# Patient Record
Sex: Female | Born: 1965 | Race: Black or African American | Hispanic: No | Smoking: Never smoker
Health system: Southern US, Community
[De-identification: ages and names within clinical notes are randomized; demographics above are authoritative.]

## PROBLEM LIST (undated history)

## (undated) DIAGNOSIS — I10 Essential (primary) hypertension: Secondary | ICD-10-CM

## (undated) DIAGNOSIS — F32A Depression, unspecified: Secondary | ICD-10-CM

## (undated) DIAGNOSIS — F329 Major depressive disorder, single episode, unspecified: Secondary | ICD-10-CM

## (undated) DIAGNOSIS — R51 Headache: Secondary | ICD-10-CM

## (undated) DIAGNOSIS — D259 Leiomyoma of uterus, unspecified: Secondary | ICD-10-CM

## (undated) DIAGNOSIS — E785 Hyperlipidemia, unspecified: Secondary | ICD-10-CM

## (undated) DIAGNOSIS — R519 Headache, unspecified: Secondary | ICD-10-CM

## (undated) HISTORY — DX: Hyperlipidemia, unspecified: E78.5

## (undated) HISTORY — DX: Major depressive disorder, single episode, unspecified: F32.9

## (undated) HISTORY — DX: Depression, unspecified: F32.A

## (undated) HISTORY — DX: Headache, unspecified: R51.9

## (undated) HISTORY — DX: Headache: R51

## (undated) HISTORY — DX: Leiomyoma of uterus, unspecified: D25.9

## (undated) HISTORY — PX: DILATION AND CURETTAGE OF UTERUS: SHX78

---

## 2017-02-22 ENCOUNTER — Ambulatory Visit: Payer: Self-pay | Attending: Oncology | Admitting: *Deleted

## 2017-02-22 ENCOUNTER — Ambulatory Visit
Admission: RE | Admit: 2017-02-22 | Discharge: 2017-02-22 | Disposition: A | Discharge status: Home / Self Care | Payer: Self-pay | Source: Ambulatory Visit | Attending: Oncology | Admitting: Oncology

## 2017-02-22 ENCOUNTER — Encounter: Payer: Self-pay | Admitting: *Deleted

## 2017-02-22 ENCOUNTER — Encounter (INDEPENDENT_AMBULATORY_CARE_PROVIDER_SITE_OTHER): Payer: Self-pay

## 2017-02-22 VITALS — BP 127/80 | HR 60 | Temp 98.6°F | Ht 62.0 in | Wt 171.0 lb

## 2017-02-22 DIAGNOSIS — Z0000 Encounter for general adult medical examination without abnormal findings: Secondary | ICD-10-CM

## 2017-02-22 NOTE — Progress Notes (Signed)
Subjective:     Patient ID: Katelyn Baldwin, female   DOB: 12/03/1965, 51 y.o.   MRN: [id number]  HPI   Review of Systems     Objective:   Physical Exam  Pulmonary/Chest: Right breast exhibits no inverted nipple, no mass, no nipple discharge, no skin change and no tenderness. Left breast exhibits no inverted nipple, no mass, no nipple discharge, no skin change and no tenderness. Breasts are symmetrical.         Assessment:     51 year old Black female presents to [facility] for clinical breast exam and mammogram only.  Clinical breast exam with bilateral thickening at 12:00.  Greater thickening at the left.  Had second RN, Katelyn Baldwin exam the patient also with the same benign findings.  Taught self breast awareness.  Last pap on 10/16/12 was negative / negative.  Next pap due in 2019.  Patient has been screened for eligibility.  She does not have any insurance, Medicare or Medicaid.  She also meets financial eligibility.  Hand-out given on the Affordable Care Act.    Plan:     Screening mammogram ordered.  Will follow-up per protocol.

## 2017-02-22 NOTE — Patient Instructions (Signed)
Gave patient hand-out, Women Staying Healthy, Active and Well from [facility], with education on breast health, pap smears, heart and colon health. 

## 2017-03-03 ENCOUNTER — Inpatient Hospital Stay
Admission: RE | Admit: 2017-03-03 | Discharge: 2017-03-03 | Disposition: A | Discharge status: Home / Self Care | Payer: Self-pay | Source: Ambulatory Visit | Attending: *Deleted | Admitting: *Deleted

## 2017-03-03 ENCOUNTER — Other Ambulatory Visit: Payer: Self-pay | Admitting: *Deleted

## 2017-03-03 DIAGNOSIS — Z9289 Personal history of other medical treatment: Secondary | ICD-10-CM

## 2017-03-07 ENCOUNTER — Encounter: Payer: Self-pay | Admitting: *Deleted

## 2017-03-07 NOTE — Progress Notes (Signed)
Letter mailed from the [facility] to inform patient of her normal mammogram results.  Patient is to follow-up with annual screening in one year.  HSIS to Christy. 

## 2017-03-16 ENCOUNTER — Other Ambulatory Visit: Payer: Self-pay

## 2017-03-16 ENCOUNTER — Emergency Department
Admission: EM | Admit: 2017-03-16 | Discharge: 2017-03-16 | Disposition: A | Discharge status: Home / Self Care | Payer: 59 | Attending: Emergency Medicine | Admitting: Emergency Medicine

## 2017-03-16 ENCOUNTER — Emergency Department: Payer: 59

## 2017-03-16 ENCOUNTER — Encounter: Payer: Self-pay | Admitting: Emergency Medicine

## 2017-03-16 DIAGNOSIS — Y939 Activity, unspecified: Secondary | ICD-10-CM | POA: Insufficient documentation

## 2017-03-16 DIAGNOSIS — I10 Essential (primary) hypertension: Secondary | ICD-10-CM | POA: Diagnosis not present

## 2017-03-16 DIAGNOSIS — Y929 Unspecified place or not applicable: Secondary | ICD-10-CM | POA: Insufficient documentation

## 2017-03-16 DIAGNOSIS — M545 Low back pain, unspecified: Secondary | ICD-10-CM

## 2017-03-16 DIAGNOSIS — S3992XA Unspecified injury of lower back, initial encounter: Secondary | ICD-10-CM | POA: Diagnosis present

## 2017-03-16 DIAGNOSIS — X58XXXA Exposure to other specified factors, initial encounter: Secondary | ICD-10-CM | POA: Insufficient documentation

## 2017-03-16 DIAGNOSIS — Y999 Unspecified external cause status: Secondary | ICD-10-CM | POA: Diagnosis not present

## 2017-03-16 DIAGNOSIS — S39012A Strain of muscle, fascia and tendon of lower back, initial encounter: Secondary | ICD-10-CM | POA: Diagnosis not present

## 2017-03-16 HISTORY — DX: Essential (primary) hypertension: I10

## 2017-03-16 LAB — URINALYSIS, COMPLETE (UACMP) WITH MICROSCOPIC
Bilirubin Urine: NEGATIVE
GLUCOSE, UA: NEGATIVE mg/dL
HGB URINE DIPSTICK: NEGATIVE
KETONES UR: NEGATIVE mg/dL
Leukocytes, UA: NEGATIVE
NITRITE: NEGATIVE
PH: 5 (ref 5.0–8.0)
Protein, ur: NEGATIVE mg/dL
Specific Gravity, Urine: 1.017 (ref 1.005–1.030)

## 2017-03-16 LAB — POCT PREGNANCY, URINE: Preg Test, Ur: NEGATIVE

## 2017-03-16 MED ORDER — HYDROCODONE-ACETAMINOPHEN 5-325 MG PO TABS: 1.0000 | ORAL_TABLET | Freq: Four times a day (QID) | ORAL | 0 refills | Status: DC | PRN

## 2017-03-16 MED ORDER — OXYCODONE-ACETAMINOPHEN 5-325 MG PO TABS
1.0000 | ORAL_TABLET | Freq: Once | ORAL | Status: AC
Start: 2017-03-16 — End: 2017-03-16
  Administered 2017-03-16: 1 via ORAL
  Filled 2017-03-16: qty 1

## 2017-03-16 MED ORDER — METHOCARBAMOL 500 MG PO TABS
1000.0000 mg | ORAL_TABLET | Freq: Once | ORAL | Status: AC
  Administered 2017-03-16: 1000 mg via ORAL
  Filled 2017-03-16: qty 2

## 2017-03-16 MED ORDER — METHOCARBAMOL 500 MG PO TABS: 500.0000 mg | ORAL_TABLET | Freq: Four times a day (QID) | ORAL | 0 refills | Status: DC

## 2017-03-16 NOTE — ED Triage Notes (Signed)
Pt presents to ED with c/o L lower back pain. Pt states took 6 200mg  Ibuprofen yesterday, and Tylenol Arthritis 2 pills yesterday without relief. Pt states pain is worse in L lower back when she breathes or when she moves. Pt states back pain x 2 yrs.

## 2017-03-16 NOTE — Discharge Instructions (Signed)
Follow-up with [facility] clinic if any continued problems. Moist heat or ice to your  back as needed for discomfort. 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 You may take ibuprofen or tylenol  while at work.

## 2017-03-16 NOTE — ED Provider Notes (Signed)
[facility] Emergency Department Provider Note   ____________________________________________   First MD Initiated Contact with Patient 03/16/17 1050     (approximate)  I have reviewed the triage vital signs and the nursing notes.   HISTORY  Chief Complaint Back Pain   HPI Katelyn Baldwin is a 51 y.o. female is here with complaint of left lower back pain. Patient states that there has not been an actual injury. She states that she has had occasional back pain off and on for the last 2 years. She has taken ibuprofen yesterday and Tylenol without any relief. She denies any urinary symptoms or history of kidney stones. She denies any paresthesias into her lower extremities, incontinence of bowel or bladder.  Patient rates her pain as 10 over 10.   Past Medical History:  Diagnosis Date  . Hypertension     There are no active problems to display for this patient.   History reviewed. No pertinent surgical history.  Prior to Admission medications   Medication Sig Start Date End Date Taking? Authorizing Provider  HYDROcodone-acetaminophen (NORCO/VICODIN) 5-325 MG tablet Take 1 tablet by mouth every 6 (six) hours as needed for moderate pain. 03/16/17   Johnn Hai, PA-C  methocarbamol (ROBAXIN) 500 MG tablet Take 1 tablet (500 mg total) by mouth 4 (four) times daily. 03/16/17   Johnn Hai, PA-C    Allergies Patient has no known allergies.  Family History  Problem Relation Age of Onset  . Breast cancer Neg Hx     Social History Social History   Tobacco Use  . Smoking status: Never Smoker  . Smokeless tobacco: Never Used  Substance Use Topics  . Alcohol use: No    Frequency: Never  . Drug use: No    Review of Systems Constitutional: No fever/chills Eyes: No visual changes. Cardiovascular: Denies chest pain. Respiratory: Denies shortness of breath. Gastrointestinal: No abdominal pain.  No nausea, no vomiting.   Genitourinary:  Negative for dysuria. Negative for kidney stones. Musculoskeletal: positive for left back pain. Skin: Negative for rash. Neurological: Negative for headaches, focal weakness or numbness. ____________________________________________   PHYSICAL EXAM:  VITAL SIGNS: ED Triage Vitals  Enc Vitals Group     BP 03/16/17 1002 (!) 161/86     Pulse Rate 03/16/17 1002 77     Resp 03/16/17 1002 18     Temp 03/16/17 1002 98.2 F (36.8 C)     Temp Source 03/16/17 1002 Oral     SpO2 03/16/17 1002 98 %     Weight 03/16/17 1003 170 lb (77.1 kg)     Height 03/16/17 1003 5\' 1"  (1.549 m)     Head Circumference --      Peak Flow --      Pain Score 03/16/17 1009 10     Pain Loc --      Pain Edu? --      Excl. in [place]? --    Constitutional: Alert and oriented. Well appearing and in no acute distress. Eyes: Conjunctivae are normal. Head: Atraumatic. Neck: No stridor.   Cardiovascular: Normal rate, regular rhythm. Grossly normal heart sounds.  Good peripheral circulation. Respiratory: Normal respiratory effort.  No retractions. Lungs CTAB. Gastrointestinal: Soft and nontender. No distention.  No CVA tenderness. Musculoskeletal: on examination back there is no gross deformity however there is some mild lower lumbar tenderness to palpation on the spinous process and left paravertebral muscles. Range of motion is greatly restricted secondary to discomfort. Straight leg raises  are negative. Neurologic:  Normal speech and language. No gross focal neurologic deficits are appreciated. Reflexes are 2+ bilaterally. Gait was not assessed as patient had difficulty with movement. Skin:  Skin is warm, dry and intact.  Psychiatric: Mood and affect are normal. Speech and behavior are normal.  ____________________________________________   LABS (all labs ordered are listed, but only abnormal results are displayed)  Labs Reviewed  URINALYSIS, COMPLETE (UACMP) WITH MICROSCOPIC - Abnormal; Notable for the following  components:      Result Value   Color, Urine YELLOW (*)    APPearance HAZY (*)    Bacteria, UA RARE (*)    Squamous Epithelial / LPF 6-30 (*)    All other components within normal limits  POC URINE PREG, ED  POCT PREGNANCY, URINE    RADIOLOGY  Dg Lumbar Spine 2-3 Views  Result Date: 03/16/2017 CLINICAL DATA:  Lumbago EXAM: LUMBAR SPINE - 2-3 VIEW COMPARISON:  None. FINDINGS: Frontal, lateral, and spot lumbosacral lateral images were obtained. There are 5 non-rib-bearing lumbar type vertebral bodies. There is no fracture or spondylolisthesis. The disc spaces appear unremarkable. No erosive change. IMPRESSION: No fracture or spondylolisthesis. No appreciable arthropathic change. Electronically Signed   By: Lowella Grip III M.D.   On: 03/16/2017 12:08    ____________________________________________   PROCEDURES  Procedure(s) performed: None  Procedures  Critical Care performed: No  ____________________________________________   INITIAL IMPRESSION / ASSESSMENT AND PLAN / ED COURSE Patient was given Robaxin 1,000mg  and Percocet 5/325 while in the department. We discussed test results and patient is reassured that there are no acute abnormalities. Patient is to follow-up with [facility] clinic if any continued problems. She was discharged with a prescription for Robaxin 500 mg 4 times a day as needed for muscle spasms and Norco one every 6 hours and needed for pain.  ____________________________________________   FINAL CLINICAL IMPRESSION(S) / ED DIAGNOSES  Final diagnoses:  Strain of lumbar region, initial encounter  Acute bilateral low back pain without sciatica     ED Discharge Orders        Ordered    methocarbamol (ROBAXIN) 500 MG tablet  4 times daily     03/16/17 1220    HYDROcodone-acetaminophen (NORCO/VICODIN) 5-325 MG tablet  Every 6 hours PRN     03/16/17 1220       Note:  This document was prepared using Dragon voice recognition software and may  include unintentional dictation errors.    Johnn Hai, PA-C 03/16/17 1539    Darel Hong, MD 03/16/17 2237

## 2017-07-27 ENCOUNTER — Emergency Department
Admission: EM | Admit: 2017-07-27 | Discharge: 2017-07-27 | Disposition: A | Discharge status: Home / Self Care | Payer: No Typology Code available for payment source | Attending: Emergency Medicine | Admitting: Emergency Medicine

## 2017-07-27 ENCOUNTER — Encounter: Payer: Self-pay | Admitting: Emergency Medicine

## 2017-07-27 ENCOUNTER — Emergency Department: Payer: No Typology Code available for payment source

## 2017-07-27 DIAGNOSIS — I10 Essential (primary) hypertension: Secondary | ICD-10-CM | POA: Insufficient documentation

## 2017-07-27 DIAGNOSIS — Z79899 Other long term (current) drug therapy: Secondary | ICD-10-CM | POA: Insufficient documentation

## 2017-07-27 DIAGNOSIS — R51 Headache: Secondary | ICD-10-CM | POA: Insufficient documentation

## 2017-07-27 DIAGNOSIS — R519 Headache, unspecified: Secondary | ICD-10-CM

## 2017-07-27 DIAGNOSIS — R55 Syncope and collapse: Secondary | ICD-10-CM | POA: Diagnosis not present

## 2017-07-27 DIAGNOSIS — R42 Dizziness and giddiness: Secondary | ICD-10-CM | POA: Diagnosis present

## 2017-07-27 LAB — URINALYSIS, COMPLETE (UACMP) WITH MICROSCOPIC
BACTERIA UA: NONE SEEN
BILIRUBIN URINE: NEGATIVE
Glucose, UA: NEGATIVE mg/dL
Hgb urine dipstick: NEGATIVE
KETONES UR: 5 mg/dL — AB
Leukocytes, UA: NEGATIVE
Nitrite: NEGATIVE
PH: 5 (ref 5.0–8.0)
PROTEIN: NEGATIVE mg/dL
SQUAMOUS EPITHELIAL / LPF: NONE SEEN (ref 0–5)
Specific Gravity, Urine: 1.018 (ref 1.005–1.030)

## 2017-07-27 LAB — BASIC METABOLIC PANEL
Anion gap: 9 (ref 5–15)
BUN: 13 mg/dL (ref 6–20)
CHLORIDE: 101 mmol/L (ref 101–111)
CO2: 26 mmol/L (ref 22–32)
Calcium: 9.2 mg/dL (ref 8.9–10.3)
Creatinine, Ser: 0.72 mg/dL (ref 0.44–1.00)
GFR calc non Af Amer: >60 mL/min (ref >60)
Glucose, Bld: 96 mg/dL (ref 65–99)
POTASSIUM: 3.6 mmol/L (ref 3.5–5.1)
Sodium: 136 mmol/L (ref 135–145)

## 2017-07-27 LAB — HCG, QUANTITATIVE, PREGNANCY: HCG, BETA CHAIN, QUANT, S: 1 m[IU]/mL (ref <5)

## 2017-07-27 LAB — CBC
HEMATOCRIT: 36.6 % (ref 35.0–47.0)
Hemoglobin: 12.5 g/dL (ref 12.0–16.0)
MCH: 27.4 pg (ref 26.0–34.0)
MCHC: 34.1 g/dL (ref 32.0–36.0)
MCV: 80.4 fL (ref 80.0–100.0)
PLATELETS: 270 10*3/uL (ref 150–440)
RBC: 4.56 MIL/uL (ref 3.80–5.20)
RDW: 14.7 % — ABNORMAL HIGH (ref 11.5–14.5)
WBC: 4.6 10*3/uL (ref 3.6–11.0)

## 2017-07-27 LAB — TROPONIN I

## 2017-07-27 MED ORDER — METOCLOPRAMIDE HCL 5 MG/ML IJ SOLN
10.0000 mg | Freq: Once | INTRAMUSCULAR | Status: AC
  Administered 2017-07-27: 10 mg via INTRAVENOUS
  Filled 2017-07-27: qty 2

## 2017-07-27 MED ORDER — KETOROLAC TROMETHAMINE 30 MG/ML IJ SOLN
15.0000 mg | Freq: Once | INTRAMUSCULAR | Status: DC
  Filled 2017-07-27: qty 1

## 2017-07-27 MED ORDER — DIPHENHYDRAMINE HCL 50 MG/ML IJ SOLN
25.0000 mg | Freq: Once | INTRAMUSCULAR | Status: AC
  Administered 2017-07-27: 25 mg via INTRAVENOUS
  Filled 2017-07-27: qty 1

## 2017-07-27 MED ORDER — SODIUM CHLORIDE 0.9 % IV BOLUS
1000.0000 mL | Freq: Once | INTRAVENOUS | Status: AC
  Administered 2017-07-27: 1000 mL via INTRAVENOUS

## 2017-07-27 NOTE — ED Provider Notes (Signed)
[facility] Emergency Department Provider Note  ____________________________________________  Time seen: Approximately 6:22 PM  I have reviewed the triage vital signs and the nursing notes.   HISTORY  Chief Complaint Dizziness and Hypertension   HPI Katelyn Baldwin is a 52 y.o. female the history of hypertension migraine headaches presents for evaluation of a syncopal event.  Patient reports that she woke up this morning not feeling so great.  She went to work, she works in a preschool.  She had not had anything to eat.  Around lunchtime she started feeling 1 of her migraines coming on.  She reports that the pain started on the right side of her face, initially with visual floaters, photophobia, started mild and got progressively worse. She reports that she has these headaches all the time when she does not eat and she calls them "hungry headache". As she was walking to go to lunch, patient reports having a syncopal episode.  She denies feeling dizzy right before that.  When EMS arrived she was found to be hypertensive with BP of 210/118.  Currently her headaches 8 out of 10.  She continues to report that this is identical to her chronic headaches.  She endorses compliance with her antihypertensive medications.  She denies facial droop, slurred speech, unilateral weakness or numbness, chest pain, shortness of breath, abdominal pain, recent illness.   Past Medical History:  Diagnosis Date  . Hypertension     There are no active problems to display for this patient.   History reviewed. No pertinent surgical history.  Prior to Admission medications   Medication Sig Start Date End Date Taking? Authorizing Provider  hydrochlorothiazide (MICROZIDE) 12.5 MG capsule Take 1 capsule by mouth daily. 07/21/17  Yes [provider]  HYDROcodone-acetaminophen (NORCO/VICODIN) 5-325 MG tablet Take 1 tablet by mouth every 6 (six) hours as needed for moderate  pain. Patient not taking: Reported on 07/27/2017 03/16/17   Johnn Hai, PA-C  methocarbamol (ROBAXIN) 500 MG tablet Take 1 tablet (500 mg total) by mouth 4 (four) times daily. Patient not taking: Reported on 07/27/2017 03/16/17   Johnn Hai, PA-C    Allergies Patient has no known allergies.  Family History  Problem Relation Age of Onset  . Breast cancer Neg Hx     Social History Social History   Tobacco Use  . Smoking status: Never Smoker  . Smokeless tobacco: Never Used  Substance Use Topics  . Alcohol use: No    Frequency: Never  . Drug use: No    Review of Systems  Constitutional: Negative for fever. + syncope Eyes: Negative for visual changes. ENT: Negative for sore throat. Neck: No neck pain  Cardiovascular: Negative for chest pain. Respiratory: Negative for shortness of breath. Gastrointestinal: Negative for abdominal pain, vomiting or diarrhea. Genitourinary: Negative for dysuria. Musculoskeletal: Negative for back pain. Skin: Negative for rash. Neurological: + headache. No weakness or numbness. Psych: No SI or HI  ____________________________________________   PHYSICAL EXAM:  VITAL SIGNS: ED Triage Vitals  Enc Vitals Group     BP 07/27/17 1500 (!) 165/89     Pulse Rate 07/27/17 1500 75     Resp 07/27/17 1500 17     Temp 07/27/17 1500 98.6 F (37 C)     Temp Source 07/27/17 1500 Oral     SpO2 07/27/17 1500 96 %     Weight 07/27/17 1501 171 lb (77.6 kg)     Height 07/27/17 1501 4\' 11"  (1.499 m)  Head Circumference --      Peak Flow --      Pain Score 07/27/17 1501 0     Pain Loc --      Pain Edu? --      Excl. in [place]? --     Constitutional: Alert and oriented. Well appearing and in no apparent distress. HEENT:      Head: Normocephalic and atraumatic.         Eyes: Conjunctivae are normal. Sclera is non-icteric.  No raccoon eyes      Ears: No hemotympanum or battle sign        Mouth/Throat: Mucous membranes are moist.        Neck: Supple with no signs of meningismus. Cardiovascular: Regular rate and rhythm. No murmurs, gallops, or rubs. 2+ symmetrical distal pulses are present in all extremities. No JVD. Respiratory: Normal respiratory effort. Lungs are clear to auscultation bilaterally. No wheezes, crackles, or rhonchi.  Gastrointestinal: Soft, non tender, and non distended with positive bowel sounds. No rebound or guarding. Musculoskeletal: Nontender with normal range of motion in all extremities. No edema, cyanosis, or erythema of extremities. Neurologic: Normal speech and language. A & O x3, PERRL, EOMI, no nystagmus, CN II-XII intact, motor testing reveals good tone and bulk throughout. There is no evidence of pronator drift or dysmetria. Muscle strength is 5/5 throughout.  Sensory examination is intact. Gait is normal. Skin: Skin is warm, dry and intact. No rash noted. Psychiatric: Mood and affect are normal. Speech and behavior are normal.  ____________________________________________   LABS (all labs ordered are listed, but only abnormal results are displayed)  Labs Reviewed  CBC - Abnormal; Notable for the following components:      Result Value   RDW 14.7 (*)    All other components within normal limits  URINALYSIS, COMPLETE (UACMP) WITH MICROSCOPIC - Abnormal; Notable for the following components:   Color, Urine YELLOW (*)    APPearance CLEAR (*)    Ketones, ur 5 (*)    All other components within normal limits  BASIC METABOLIC PANEL  HCG, QUANTITATIVE, PREGNANCY  TROPONIN I   ____________________________________________  EKG  ED ECG REPORT I, Rudene Re, the attending physician, personally viewed and interpreted this ECG.  Normal sinus rhythm, normal intervals, normal axis, no STE, less than 1 mm ST depressions in 2, 3, aVF, V3 to V6, no evidence of HOCM, AV block, delta wave, ARVD, prolonged QTc, WPW, or Brugada.  No prior for  comparison  ____________________________________________  RADIOLOGY  I have personally reviewed the images performed during this visit and I agree with the Radiologist's read.   Interpretation by Radiologist:  Ct Head Wo Contrast  Result Date: 07/27/2017 CLINICAL DATA:  Hypertension with syncopal episode EXAM: CT HEAD WITHOUT CONTRAST TECHNIQUE: Contiguous axial images were obtained from the base of the skull through the vertex without intravenous contrast. COMPARISON:  None. FINDINGS: Brain: No evidence of acute infarction, hemorrhage, hydrocephalus, extra-axial collection or mass lesion/mass effect. Vascular: No hyperdense vessel or unexpected calcification. Skull: Normal. Negative for fracture or focal lesion. Sinuses/Orbits: No acute finding. Other: None IMPRESSION: Negative non contrasted CT appearance of the brain. Electronically Signed   By: Donavan Foil M.D.   On: 07/27/2017 18:39     ____________________________________________   PROCEDURES  Procedure(s) performed: None Procedures Critical Care performed:  None ____________________________________________   INITIAL IMPRESSION / ASSESSMENT AND PLAN / ED COURSE   52 y.o. female the history of hypertension migraine headaches presents for evaluation of  a syncopal event.  The event was preceded by 1 of patient's chronic headaches.  She denies thunderclap headache.  She is neurologically intact with no signs of trauma.  Her EKG shows slight ST depressions in inferior and lateral leads with no ST elevation, no evidence of dysrhythmia.  There is not a prior EKG for comparison. Troponin is pending.  Patient has mild ketones in her urine.  I believe her presentation is due to mild dehydration and the fact the patient did not eat today.  Head CT has been done within 6 hours of onset of HA with no evidence of SAH. Patient is completely neurologically intact with no signs of stroke. Labs showing normal creatinine and electrolytes. Will  treat HA with migraine cocktail.     _________________________ 8:28 PM on 07/27/2017 -----------------------------------------  Patient's headache has fully resolved.  She remains extremely well-appearing, neurologically intact with normal vital signs.  At this time she is stable for discharge.  Recommended close follow-up with primary care doctor and discussed return precautions.   As part of my medical decision making, I reviewed the following data within the [place] notes reviewed and incorporated, Labs reviewed , EKG interpreted , Old chart reviewed, Radiograph reviewed , Notes from prior ED visits and [place] Controlled Substance Database    Pertinent labs & imaging results that were available during my care of the patient were reviewed by me and considered in my medical decision making (see chart for details).    ____________________________________________   FINAL CLINICAL IMPRESSION(S) / ED DIAGNOSES  Final diagnoses:  Syncope, unspecified syncope type  Acute nonintractable headache, unspecified headache type      NEW MEDICATIONS STARTED DURING THIS VISIT:  ED Discharge Orders    None       Note:  This document was prepared using Dragon voice recognition software and may include unintentional dictation errors.    Rudene Re, MD 07/27/17 2029

## 2017-07-27 NOTE — ED Triage Notes (Signed)
Pt comes into the ED via POV c/o HTN and syncopal episode.  Pt c/o headache with photosensitivity.  Patient had a syncopal episode at work and had EMS called but she declined coming with them.  Patient states she did hit her head, but denies any blood thinners. Patient is neurologically intact.  Her BP earlier today was 210/118.  Patient has HTN and takes medication for it.  Patient does not currently have excessively high BP at this time in triage.  Patient in NAD with even and unlabored respirations.

## 2017-07-27 NOTE — Discharge Instructions (Addendum)
You have been seen in the Emergency Department (ED) for a headache. Your evaluation today was overall reassuring. Headaches have many possible causes. Most headaches aren't a sign of a more serious problem, and they will get better on their own.   Follow-up with your doctor in 12-24 hours if you are still having a headache. Otherwise follow up with your doctor in 3-5 days.  For pain take tylenol or motrin  When should you call for help?  Call 911 or return to the ED anytime you think you may need emergency care. For example, call if:  You have signs of a stroke. These may include:  Sudden numbness, paralysis, or weakness in your face, arm, or leg, especially on only one side of your body.  Sudden vision changes.  Sudden trouble speaking.  Sudden confusion or trouble understanding simple statements.  Sudden problems with walking or balance.  A sudden, severe headache that is different from past headaches. You have new or worsening headache Nausea and vomiting associated with your headache Fever, neck stiffness associated with your headache  Call your doctor now or seek immediate medical care if:  You have a new or worse headache.  Your headache gets much worse.  How can you care for yourself at home?  Do not drive if you have taken a prescription pain medicine.  Rest in a quiet, dark room until your headache is gone. Close your eyes and try to relax or go to sleep. Don't watch TV or read.  Put a cold, moist cloth or cold pack on the painful area for 10 to 20 minutes at a time. Put a thin cloth between the cold pack and your skin.  Use a warm, moist towel or a heating pad set on low to relax tight shoulder and neck muscles.  Have someone gently massage your neck and shoulders.  Take pain medicines exactly as directed.  If the doctor gave you a prescription medicine for pain, take it as prescribed.  If you are not taking a prescription pain medicine, ask your doctor if you can take an  over-the-counter medicine. Be careful not to take pain medicine more often than the instructions allow, because you may get worse or more frequent headaches when the medicine wears off.  Do not ignore new symptoms that occur with a headache, such as a fever, weakness or numbness, vision changes, or confusion. These may be signs of a more serious problem.  To prevent headaches  Keep a headache diary so you can figure out what triggers your headaches. Avoiding triggers may help you prevent headaches. Record when each headache began, how long it lasted, and what the pain was like (throbbing, aching, stabbing, or dull). Write down any other symptoms you had with the headache, such as nausea, flashing lights or dark spots, or sensitivity to bright light or loud noise. Note if the headache occurred near your period. List anything that might have triggered the headache, such as certain foods (chocolate, cheese, wine) or odors, smoke, bright light, stress, or lack of sleep.  Find healthy ways to deal with stress. Headaches are most common during or right after stressful times. Take time to relax before and after you do something that has caused a headache in the past.  Try to keep your muscles relaxed by keeping good posture. Check your jaw, face, neck, and shoulder muscles for tension, and try relaxing them. When sitting at a desk, change positions often, and stretch for 30 seconds each hour.  Get plenty of sleep and exercise.  °Eat regularly and well. Long periods without food can trigger a headache.  °Treat yourself to a massage. Some people find that regular massages are very helpful in relieving tension.  °Limit caffeine by not drinking too much coffee, tea, or soda. But don't quit caffeine suddenly, because that can also give you headaches.  °Reduce eyestrain from computers by blinking frequently and looking away from the computer screen every so often. Make sure you have proper eyewear and that your monitor  is set up properly, about an arm's length away.  °Seek help if you have depression or anxiety. Your headaches may be linked to these conditions. Treatment can both prevent headaches and help with symptoms of anxiety or depression. ° °

## 2017-08-15 ENCOUNTER — Telehealth: Payer: Self-pay | Admitting: Internal Medicine

## 2017-08-15 ENCOUNTER — Ambulatory Visit: Payer: 59 | Admitting: Internal Medicine

## 2017-08-15 ENCOUNTER — Ambulatory Visit (INDEPENDENT_AMBULATORY_CARE_PROVIDER_SITE_OTHER): Payer: 59

## 2017-08-15 ENCOUNTER — Encounter: Payer: Self-pay | Admitting: Internal Medicine

## 2017-08-15 VITALS — BP 138/70 | HR 65 | Temp 98.3°F | Ht 59.0 in | Wt 178.4 lb

## 2017-08-15 DIAGNOSIS — Z1159 Encounter for screening for other viral diseases: Secondary | ICD-10-CM

## 2017-08-15 DIAGNOSIS — F329 Major depressive disorder, single episode, unspecified: Secondary | ICD-10-CM | POA: Diagnosis not present

## 2017-08-15 DIAGNOSIS — Z1211 Encounter for screening for malignant neoplasm of colon: Secondary | ICD-10-CM

## 2017-08-15 DIAGNOSIS — F439 Reaction to severe stress, unspecified: Secondary | ICD-10-CM | POA: Diagnosis not present

## 2017-08-15 DIAGNOSIS — N926 Irregular menstruation, unspecified: Secondary | ICD-10-CM | POA: Diagnosis not present

## 2017-08-15 DIAGNOSIS — H6123 Impacted cerumen, bilateral: Secondary | ICD-10-CM

## 2017-08-15 DIAGNOSIS — F32A Depression, unspecified: Secondary | ICD-10-CM | POA: Insufficient documentation

## 2017-08-15 DIAGNOSIS — E559 Vitamin D deficiency, unspecified: Secondary | ICD-10-CM

## 2017-08-15 DIAGNOSIS — Z23 Encounter for immunization: Secondary | ICD-10-CM | POA: Diagnosis not present

## 2017-08-15 DIAGNOSIS — I10 Essential (primary) hypertension: Secondary | ICD-10-CM | POA: Diagnosis not present

## 2017-08-15 DIAGNOSIS — R202 Paresthesia of skin: Secondary | ICD-10-CM

## 2017-08-15 DIAGNOSIS — R0602 Shortness of breath: Secondary | ICD-10-CM

## 2017-08-15 DIAGNOSIS — H9209 Otalgia, unspecified ear: Secondary | ICD-10-CM

## 2017-08-15 DIAGNOSIS — Z1329 Encounter for screening for other suspected endocrine disorder: Secondary | ICD-10-CM | POA: Diagnosis not present

## 2017-08-15 DIAGNOSIS — R200 Anesthesia of skin: Secondary | ICD-10-CM | POA: Diagnosis not present

## 2017-08-15 DIAGNOSIS — Z0184 Encounter for antibody response examination: Secondary | ICD-10-CM

## 2017-08-15 DIAGNOSIS — E669 Obesity, unspecified: Secondary | ICD-10-CM

## 2017-08-15 MED ORDER — AMLODIPINE BESYLATE 2.5 MG PO TABS: 2.5000 mg | ORAL_TABLET | Freq: Every day | ORAL | 3 refills | Status: DC

## 2017-08-15 NOTE — Telephone Encounter (Signed)
Please advise 

## 2017-08-15 NOTE — Telephone Encounter (Signed)
Copied from [place] [phone]. Topic: Quick Communication - Rx Refill/Question >> Aug 15, 2017  6:26 PM Tye [place] wrote: Pt called and states that she was suppose to get an ear drop Rx for her ears but did not get it, call pt to advise

## 2017-08-15 NOTE — Progress Notes (Signed)
Chief Complaint  Patient presents with  . New Patient (Initial Visit)   New patient multiple issues.  1. C/o ear pain  2.HTN 138/70 on hctz 12.5 but it does spike at times >200/>165. She feels sensation in her back when her BP elevates  3. Recently saw white spots and eyes checked [facility] will get records  4. H/o depression and stress (kids are source of stress ages 59, 43, 51) PHQ 9 score 20 she wants a referral for therapy instead of medication currently  5. C/o left sided tingling randomly and intermittently esp 4th and 5th fingers left hand. Denies neck pain. CT head 07/27/17 normal in the ED 6. Chronic left knee pain and low back pain recent lumbar Xray normal  7. C/o irregular cycles and has not had menses in 2 months she is unsure is has menopause  8. Obesity BMI >36 does not currently work out and likes sweets  Review of Systems  Constitutional: Negative for weight loss.  HENT: Positive for ear pain. Negative for hearing loss.   Eyes:       +seeing white spots  Respiratory: Negative for shortness of breath.   Cardiovascular: Negative for chest pain.  Gastrointestinal: Negative for abdominal pain.  Musculoskeletal: Negative for back pain and joint pain.       +chronic left knee and low back pain   Skin: Negative for rash.  Neurological: Positive for dizziness. Negative for headaches.  Psychiatric/Behavioral: Positive for depression.       +stress    Past Medical History:  Diagnosis Date  . Hypertension    History reviewed. No pertinent surgical history. Family History  Problem Relation Age of Onset  . Breast cancer Neg Hx    Social History   Socioeconomic History  . Marital status: Widowed    Spouse name: Not on file  . Number of children: Not on file  . Years of education: Not on file  . Highest education level: Not on file  Occupational History  . Not on file  Social Needs  . Financial resource strain: Not on file  . Food insecurity:   Worry: Not on file    Inability: Not on file  . Transportation needs:    Medical: Not on file    Non-medical: Not on file  Tobacco Use  . Smoking status: Never Smoker  . Smokeless tobacco: Never Used  Substance and Sexual Activity  . Alcohol use: No    Frequency: Never  . Drug use: No  . Sexual activity: Not on file  Lifestyle  . Physical activity:    Days per week: Not on file    Minutes per session: Not on file  . Stress: Not on file  Relationships  . Social connections:    Talks on phone: Not on file    Gets together: Not on file    Attends religious service: Not on file    Active member of club or organization: Not on file    Attends meetings of clubs or organizations: Not on file    Relationship status: Not on file  . Intimate partner violence:    Fear of current or ex partner: Not on file    Emotionally abused: Not on file    Physically abused: Not on file    Forced sexual activity: Not on file  Other Topics Concern  . Not on file  Social History Narrative  . Not on file   Current Meds  Medication  Sig  . hydrochlorothiazide (MICROZIDE) 12.5 MG capsule Take 1 capsule by mouth daily.   No Known Allergies Recent Results (from the past 2160 hour(s))  Basic metabolic panel     Status: None   Collection Time: 07/27/17  3:02 PM  Result Value Ref Range   Sodium 136 135 - 145 mmol/L   Potassium 3.6 3.5 - 5.1 mmol/L   Chloride 101 101 - 111 mmol/L   CO2 26 22 - 32 mmol/L   Glucose, Bld 96 65 - 99 mg/dL   BUN 13 6 - 20 mg/dL   Creatinine, Ser 0.72 0.44 - 1.00 mg/dL   Calcium 9.2 8.9 - 10.3 mg/dL   GFR calc non Af Amer >60 >60 mL/min   GFR calc Af Amer >60 >60 mL/min    Comment: (NOTE) The eGFR has been calculated using the CKD EPI equation. This calculation has not been validated in all clinical situations. eGFR's persistently <60 mL/min signify possible Chronic Kidney Disease.    Anion gap 9 5 - 15    Comment: Performed at [facility], [address] [place], [place] [address]  CBC     Status: Abnormal   Collection Time: 07/27/17  3:02 PM  Result Value Ref Range   WBC 4.6 3.6 - 11.0 K/uL   RBC 4.56 3.80 - 5.20 MIL/uL   Hemoglobin 12.5 12.0 - 16.0 g/dL   HCT 36.6 35.0 - 47.0 %   MCV 80.4 80.0 - 100.0 fL   MCH 27.4 26.0 - 34.0 pg   MCHC 34.1 32.0 - 36.0 g/dL   RDW 14.7 (H) 11.5 - 14.5 %   Platelets 270 150 - 440 K/uL    Comment: Performed at [facility], [address] [place], [place] [address]  Urinalysis, Complete w Microscopic     Status: Abnormal   Collection Time: 07/27/17  3:02 PM  Result Value Ref Range   Color, Urine YELLOW (A) YELLOW   APPearance CLEAR (A) CLEAR   Specific Gravity, Urine 1.018 1.005 - 1.030   pH 5.0 5.0 - 8.0   Glucose, UA NEGATIVE NEGATIVE mg/dL   Hgb urine dipstick NEGATIVE NEGATIVE   Bilirubin Urine NEGATIVE NEGATIVE   Ketones, ur 5 (A) NEGATIVE mg/dL   Protein, ur NEGATIVE NEGATIVE mg/dL   Nitrite NEGATIVE NEGATIVE   Leukocytes, UA NEGATIVE NEGATIVE   RBC / HPF 0-5 0 - 5 RBC/hpf   WBC, UA 0-5 0 - 5 WBC/hpf   Bacteria, UA NONE SEEN NONE SEEN   Squamous Epithelial / LPF NONE SEEN 0 - 5    Comment: Please note change in reference range.   Mucus PRESENT     Comment: Performed at [facility], [address] [place], [place] [address]  hCG, quantitative, pregnancy     Status: None   Collection Time: 07/27/17  3:02 PM  Result Value Ref Range   hCG, Beta Chain, Quant, S 1 <5 mIU/mL    Comment:          GEST. AGE      CONC.  (mIU/mL)   <=1 WEEK        5 - 50     2 WEEKS       50 - 500     3 WEEKS       100 - 10,000     4 WEEKS     1,000 - 30,000     5 WEEKS     3,500 - 115,000  6-8 WEEKS     12,000 - 270,000    12 WEEKS     15,000 - 220,000        FEMALE AND NON-PREGNANT FEMALE:     LESS THAN 5 mIU/mL Performed at [facility], [address] [place], [place] [address]   Troponin I     Status: None   Collection Time: 07/27/17  3:02 PM  Result Value  Ref Range   Troponin I <0.03 <0.03 ng/mL    Comment: Performed at [facility], [address] [place], [place] [address]   Objective  Body mass index is 36.03 kg/m. Wt Readings from Last 3 Encounters:  08/15/17 178 lb 6.4 oz (80.9 kg)  07/27/17 171 lb (77.6 kg)  03/16/17 170 lb (77.1 kg)   Temp Readings from Last 3 Encounters:  08/15/17 98.3 F (36.8 C) (Oral)  07/27/17 98.6 F (37 C) (Oral)  03/16/17 98.2 F (36.8 C) (Oral)   BP Readings from Last 3 Encounters:  08/15/17 138/70  07/27/17 (!) 156/94  03/16/17 (!) 160/85   Pulse Readings from Last 3 Encounters:  08/15/17 65  07/27/17 76  03/16/17 75    Physical Exam  Constitutional: She is oriented to person, place, and time. Vital signs are normal. She appears well-developed and well-nourished. She is cooperative.  HENT:  Head: Normocephalic and atraumatic.  Mouth/Throat: Oropharynx is clear and moist and mucous membranes are normal.  Cerumen impaction b/l ears   Eyes: Pupils are equal, round, and reactive to light. Conjunctivae are normal.  Cardiovascular: Normal rate, regular rhythm and normal heart sounds.  Pulmonary/Chest: Effort normal and breath sounds normal.  Abdominal: Soft. Bowel sounds are normal.  Neurological: She is alert and oriented to person, place, and time. Gait normal.  Skin: Skin is warm, dry and intact.  Psychiatric: She has a normal mood and affect. Her speech is normal and behavior is normal. Judgment and thought content normal. Cognition and memory are normal.  Nursing note and vitals reviewed.   Assessment   1. Otalgia likely 2/2 cerumen impaction  2. HTN with intermittent spikes  3. Depression and stress PHQ 9 score 20  4. Left arm and fingers 4th and 5th tingling CT head neg 07/27/17  5. Chronic left knee pain and low back pain  6. Irregular menses not had cycle in 2 months  7. Obesity BMI >36  8. HM Plan   1.cleaned ears today rec use debrox drops 2. Add norvasc 2.5 mg  qd may take another 2.5 mg if BP>140/>90  Cont hctz 12.5 mg qd  Get [facility] records 07/2017  Consider echo of heart in future given chronic HTN to look for LVH Check CXR today r/o pulm edema with BP >200/>100 at times  3. Refer to [facility] therapy pt does not want meds for now  4. Consider EMG/NCS, MRI brain 5. Lumbar Xray just normal  6. Check FSH 7. Disc exercsie and healthy diet choicse  8.l  No longer takes flu shot  Given Tdap today  Hep B  Declines STD check  Pap at f/u  Referred colonoscopy prefers female  Mammogram 02/22/17 normal   Provider: Dr. Olivia Mackie McLean-Scocuzza-Internal Medicine

## 2017-08-15 NOTE — Progress Notes (Signed)
Pre visit review using our clinic review tool, if applicable. No additional management support is needed unless otherwise documented below in the visit note. 

## 2017-08-15 NOTE — Patient Instructions (Addendum)
Schedule fasting labs after 09/03/17  F/u with me in 1 month  Will refer you to therapy locally  Please get your glasses  We will do CXR today  Meditation apps for stress on the phone Insight timer, Calm, Headspace   We will refer you to GI for colonoscopy and do pap at follow up   Paresthesia Paresthesia is an abnormal burning or prickling sensation. This sensation is generally felt in the hands, arms, legs, or feet. However, it may occur in any part of the body. Usually, it is not painful. The feeling may be described as:  Tingling or numbness.  Pins and needles.  Skin crawling.  Buzzing.  Limbs falling asleep.  Itching.  Most people experience temporary (transient) paresthesia at some time in their lives. Paresthesia may occur when you breathe too quickly (hyperventilation). It can also occur without any apparent cause. Commonly, paresthesia occurs when pressure is placed on a nerve. The sensation quickly goes away after the pressure is removed. For some people, however, paresthesia is a long-lasting (chronic) condition that is caused by an underlying disorder. If you continue to have paresthesia, you may need further medical evaluation. Follow these instructions at home: Watch your condition for any changes. Taking the following actions may help to lessen any discomfort that you are feeling:  Avoid drinking alcohol.  Try acupuncture or massage to help relieve your symptoms.  Keep all follow-up visits as directed by your health care provider. This is important.  Contact a health care provider if:  You continue to have episodes of paresthesia.  Your burning or prickling feeling gets worse when you walk.  You have pain, cramps, or dizziness.  You develop a rash. Get help right away if:  You feel weak.  You have trouble walking or moving.  You have problems with speech, understanding, or vision.  You feel confused.  You cannot control your bladder or bowel  movements.  You have numbness after an injury.  You faint. This information is not intended to replace advice given to you by your health care provider. Make sure you discuss any questions you have with your health care provider. Document Released: 03/11/2002 Document Revised: 08/27/2015 Document Reviewed: 03/17/2014 Elsevier Interactive Patient Education  2018 [place], Adult An earache, or ear pain, can be caused by many things, including:  An infection.  Ear wax buildup.  Ear pressure.  Something in the ear that should not be there (foreign body).  A sore throat.  Tooth problems.  Jaw problems.  Treatment of the earache will depend on the cause. If the cause is not clear or cannot be determined, you may need to watch your symptoms until your earache goes away or until a cause is found. Follow these instructions at home: Pay attention to any changes in your symptoms. Take these actions to help with your pain:  Take or apply over-the-counter and prescription medicines only as told by your health care provider.  If you were prescribed an antibiotic medicine, use it as told by your health care provider. Do not stop using the antibiotic even if you start to feel better.  Do not put anything in your ear other than medicine that is prescribed by your health care provider.  If directed, apply heat to the affected area as often as told by your health care provider. Use the heat source that your health care provider recommends, such as a moist heat pack or a heating pad. ? Place a towel  between your skin and the heat source. ? Leave the heat on for 20-30 minutes. ? Remove the heat if your skin turns bright red. This is especially important if you are unable to feel pain, heat, or cold. You may have a greater risk of getting burned.  If directed, put ice on the ear: ? Put ice in a plastic bag. ? Place a towel between your skin and the bag. ? Leave the ice on for 20  minutes, 2-3 times a day.  Try resting in an upright position instead of lying down. This may help to reduce pressure in your ear and relieve pain.  Chew gum if it helps to relieve your ear pain.  Treat any allergies as told by your health care provider.  Keep all follow-up visits as told by your health care provider. This is important.  Contact a health care provider if:  Your pain does not improve within 2 days.  Your earache gets worse.  You have new symptoms.  You have a fever. Get help right away if:  You have a severe headache.  You have a stiff neck.  You have trouble swallowing.  You have redness or swelling behind your ear.  You have fluid or blood coming from your ear.  You have hearing loss.  You feel dizzy. This information is not intended to replace advice given to you by your health care provider. Make sure you discuss any questions you have with your health care provider. Document Released: 11/06/2003 Document Revised: 11/17/2015 Document Reviewed: 09/14/2015 Elsevier Interactive Patient Education  2018 [employer].  Dizziness Dizziness is a common problem. It is a feeling of unsteadiness or light-headedness. You may feel like you are about to faint. Dizziness can lead to injury if you stumble or fall. Anyone can become dizzy, but dizziness is more common in older adults. This condition can be caused by a number of things, including medicines, dehydration, or illness. Follow these instructions at home: Eating and drinking  Drink enough fluid to keep your urine clear or pale yellow. This helps to keep you from becoming dehydrated. Try to drink more clear fluids, such as water.  Do not drink alcohol.  Limit your caffeine intake if told to do so by your health care provider. Check ingredients and nutrition facts to see if a food or beverage contains caffeine.  Limit your salt (sodium) intake if told to do so by your health care provider. Check  ingredients and nutrition facts to see if a food or beverage contains sodium. Activity  Avoid making quick movements. ? Rise slowly from chairs and steady yourself until you feel okay. ? In the morning, first sit up on the side of the bed. When you feel okay, stand slowly while you hold onto something until you know that your balance is fine.  If you need to stand in one place for a long time, move your legs often. Tighten and relax the muscles in your legs while you are standing.  Do not drive or use heavy machinery if you feel dizzy.  Avoid bending down if you feel dizzy. Place items in your home so that they are easy for you to reach without leaning over. Lifestyle  Do not use any products that contain nicotine or tobacco, such as cigarettes and e-cigarettes. If you need help quitting, ask your health care provider.  Try to reduce your stress level by using methods such as yoga or meditation. Talk with your health care provider  if you need help to manage your stress. General instructions  Watch your dizziness for any changes.  Take over-the-counter and prescription medicines only as told by your health care provider. Talk with your health care provider if you think that your dizziness is caused by a medicine that you are taking.  Tell a friend or a family member that you are feeling dizzy. If he or she notices any changes in your behavior, have this person call your health care provider.  Keep all follow-up visits as told by your health care provider. This is important. Contact a health care provider if:  Your dizziness does not go away.  Your dizziness or light-headedness gets worse.  You feel nauseous.  You have reduced hearing.  You have new symptoms.  You are unsteady on your feet or you feel like the room is spinning. Get help right away if:  You vomit or have diarrhea and are unable to eat or drink anything.  You have problems talking, walking, swallowing, or using  your arms, hands, or legs.  You feel generally weak.  You are not thinking clearly or you have trouble forming sentences. It may take a friend or family member to notice this.  You have chest pain, abdominal pain, shortness of breath, or sweating.  Your vision changes.  You have any bleeding.  You have a severe headache.  You have neck pain or a stiff neck.  You have a fever. These symptoms may represent a serious problem that is an emergency. Do not wait to see if the symptoms will go away. Get medical help right away. Call your local emergency services (911 in the [place] Do not drive yourself to the hospital. Summary  Dizziness is a feeling of unsteadiness or light-headedness. This condition can be caused by a number of things, including medicines, dehydration, or illness.  Anyone can become dizzy, but dizziness is more common in older adults.  Drink enough fluid to keep your urine clear or pale yellow. Do not drink alcohol.  Avoid making quick movements if you feel dizzy. Monitor your dizziness for any changes. This information is not intended to replace advice given to you by your health care provider. Make sure you discuss any questions you have with your health care provider. Document Released: 09/14/2000 Document Revised: 04/23/2016 Document Reviewed: 04/23/2016 Elsevier Interactive Patient Education  2018 [place].   (drops for ears) Debrox/Carbamide Peroxide ear solution What is this medicine? CARBAMIDE PEROXIDE (CAR bah mide per OX ide) is used to soften and help remove ear wax. This medicine may be used for other purposes; ask your health care provider or pharmacist if you have questions. COMMON BRAND NAME(S): Auro Ear, Auro Earache Relief, Debrox, Ear Drops, Ear Wax Removal, Ear Wax Remover, Earwax Treatment, Murine, Thera-Ear What should I tell my health care provider before I take this medicine? They need to know if you have any of these  conditions: -dizziness -ear discharge -ear pain, irritation or rash -infection -perforated eardrum (hole in eardrum) -an unusual or allergic reaction to carbamide peroxide, glycerin, hydrogen peroxide, other medicines, foods, dyes, or preservatives -pregnant or trying to get pregnant -breast-feeding How should I use this medicine? This medicine is only for use in the outer ear canal. Follow the directions carefully. Wash hands before and after use. The solution may be warmed by holding the bottle in the hand for 1 to 2 minutes. Lie with the affected ear facing upward. Place the proper number of drops into the  ear canal. After the drops are instilled, remain lying with the affected ear upward for 5 minutes to help the drops stay in the ear canal. A cotton ball may be gently inserted at the ear opening for no longer than 5 to 10 minutes to ensure retention. Repeat, if necessary, for the opposite ear. Do not touch the tip of the dropper to the ear, fingertips, or other surface. Do not rinse the dropper after use. Keep container tightly closed. Talk to your pediatrician regarding the use of this medicine in children. While this drug may be used in children as young as 12 years for selected conditions, precautions do apply. Overdosage: If you think you have taken too much of this medicine contact a poison control center or emergency room at once. NOTE: This medicine is only for you. Do not share this medicine with others. What if I miss a dose? If you miss a dose, use it as soon as you can. If it is almost time for your next dose, use only that dose. Do not use double or extra doses. What may interact with this medicine? Interactions are not expected. Do not use any other ear products without asking your doctor or health care professional. This list may not describe all possible interactions. Give your health care provider a list of all the medicines, herbs, non-prescription drugs, or dietary  supplements you use. Also tell them if you smoke, drink alcohol, or use illegal drugs. Some items may interact with your medicine. What should I watch for while using this medicine? This medicine is not for long-term use. Do not use for more than 4 days without checking with your health care professional. Contact your doctor or health care professional if your condition does not start to get better within a few days or if you notice burning, redness, itching or swelling. What side effects may I notice from receiving this medicine? Side effects that you should report to your doctor or health care professional as soon as possible: -allergic reactions like skin rash, itching or hives, swelling of the face, lips, or tongue -burning, itching, and redness -worsening ear pain -rash Side effects that usually do not require medical attention (report to your doctor or health care professional if they continue or are bothersome): -abnormal sensation while putting the drops in the ear -temporary reduction in hearing (but not complete loss of hearing) This list may not describe all possible side effects. Call your doctor for medical advice about side effects. You may report side effects to FDA at 1-800-FDA-1088. Where should I keep my medicine? Keep out of the reach of children. Store at room temperature between 15 and 30 degrees C (59 and 86 degrees F) in a tight, light-resistant container. Keep bottle away from excessive heat and direct sunlight. Throw away any unused medicine after the expiration date. NOTE: This sheet is a summary. It may not cover all possible information. If you have questions about this medicine, talk to your doctor, pharmacist, or health care provider.  2018 Elsevier/Gold Standard (2007-07-03 14:00:02)  Earwax Buildup, Adult The ears produce a substance called earwax that helps keep bacteria out of the ear and protects the skin in the ear canal. Occasionally, earwax can build up in  the ear and cause discomfort or hearing loss. What increases the risk? This condition is more likely to develop in people who:  Are female.  Are elderly.  Naturally produce more earwax.  Clean their ears often with cotton swabs.  Use  earplugs often.  Use in-ear headphones often.  Wear hearing aids.  Have narrow ear canals.  Have earwax that is overly thick or sticky.  Have eczema.  Are dehydrated.  Have excess hair in the ear canal.  What are the signs or symptoms? Symptoms of this condition include:  Reduced or muffled hearing.  A feeling of fullness in the ear or feeling that the ear is plugged.  Fluid coming from the ear.  Ear pain.  Ear itch.  Ringing in the ear.  Coughing.  An obvious piece of earwax that can be seen inside the ear canal.  How is this diagnosed? This condition may be diagnosed based on:  Your symptoms.  Your medical history.  An ear exam. During the exam, your health care provider will look into your ear with an instrument called an otoscope.  You may have tests, including a hearing test. How is this treated? This condition may be treated by:  Using ear drops to soften the earwax.  Having the earwax removed by a health care provider. The health care provider may: ? Flush the ear with water. ? Use an instrument that has a loop on the end (curette). ? Use a suction device.  Surgery to remove the wax buildup. This may be done in severe cases.  Follow these instructions at home:  Take over-the-counter and prescription medicines only as told by your health care provider.  Do not put any objects, including cotton swabs, into your ear. You can clean the opening of your ear canal with a washcloth or facial tissue.  Follow instructions from your health care provider about cleaning your ears. Do not over-clean your ears.  Drink enough fluid to keep your urine clear or pale yellow. This will help to thin the earwax.  Keep all  follow-up visits as told by your health care provider. If earwax builds up in your ears often or if you use hearing aids, consider seeing your health care provider for routine, preventive ear cleanings. Ask your health care provider how often you should schedule your cleanings.  If you have hearing aids, clean them according to instructions from the manufacturer and your health care provider. Contact a health care provider if:  You have ear pain.  You develop a fever.  You have blood, pus, or other fluid coming from your ear.  You have hearing loss.  You have ringing in your ears that does not go away.  Your symptoms do not improve with treatment.  You feel like the room is spinning (vertigo). Summary  Earwax can build up in the ear and cause discomfort or hearing loss.  The most common symptoms of this condition include reduced or muffled hearing and a feeling of fullness in the ear or feeling that the ear is plugged.  This condition may be diagnosed based on your symptoms, your medical history, and an ear exam.  This condition may be treated by using ear drops to soften the earwax or by having the earwax removed by a health care provider.  Do not put any objects, including cotton swabs, into your ear. You can clean the opening of your ear canal with a washcloth or facial tissue. This information is not intended to replace advice given to you by your health care provider. Make sure you discuss any questions you have with your health care provider. Document Released: 04/28/2004 Document Revised: 06/01/2016 Document Reviewed: 06/01/2016 Elsevier Interactive Patient Education  2018 [place]  DASH stands for "Dietary Approaches to Stop Hypertension." The DASH eating plan is a healthy eating plan that has been shown to reduce high blood pressure (hypertension). It may also reduce your risk for type 2 diabetes, heart disease, and stroke. The DASH eating plan may  also help with weight loss. What are tips for following this plan? General guidelines  Avoid eating more than 2,300 mg (milligrams) of salt (sodium) a day. If you have hypertension, you may need to reduce your sodium intake to 1,500 mg a day.  Limit alcohol intake to no more than 1 drink a day for nonpregnant women and 2 drinks a day for men. One drink equals 12 oz of beer, 5 oz of wine, or 1 oz of hard liquor.  Work with your health care provider to maintain a healthy body weight or to lose weight. Ask what an ideal weight is for you.  Get at least 30 minutes of exercise that causes your heart to beat faster (aerobic exercise) most days of the week. Activities may include walking, swimming, or biking.  Work with your health care provider or diet and nutrition specialist (dietitian) to adjust your eating plan to your individual calorie needs. Reading food labels  Check food labels for the amount of sodium per serving. Choose foods with less than 5 percent of the Daily Value of sodium. Generally, foods with less than 300 mg of sodium per serving fit into this eating plan.  To find whole grains, look for the word "whole" as the first word in the ingredient list. Shopping  Buy products labeled as "low-sodium" or "no salt added."  Buy fresh foods. Avoid canned foods and premade or frozen meals. Cooking  Avoid adding salt when cooking. Use salt-free seasonings or herbs instead of table salt or sea salt. Check with your health care provider or pharmacist before using salt substitutes.  Do not fry foods. Cook foods using healthy methods such as baking, boiling, grilling, and broiling instead.  Cook with heart-healthy oils, such as olive, canola, soybean, or sunflower oil. Meal planning   Eat a balanced diet that includes: ? 5 or more servings of fruits and vegetables each day. At each meal, try to fill half of your plate with fruits and vegetables. ? Up to 6-8 servings of whole grains  each day. ? Less than 6 oz of lean meat, poultry, or fish each day. A 3-oz serving of meat is about the same size as a deck of cards. One egg equals 1 oz. ? 2 servings of low-fat dairy each day. ? A serving of nuts, seeds, or beans 5 times each week. ? Heart-healthy fats. Healthy fats called Omega-3 fatty acids are found in foods such as flaxseeds and coldwater fish, like sardines, salmon, and mackerel.  Limit how much you eat of the following: ? Canned or prepackaged foods. ? Food that is high in trans fat, such as fried foods. ? Food that is high in saturated fat, such as fatty meat. ? Sweets, desserts, sugary drinks, and other foods with added sugar. ? Full-fat dairy products.  Do not salt foods before eating.  Try to eat at least 2 vegetarian meals each week.  Eat more home-cooked food and less restaurant, buffet, and fast food.  When eating at a restaurant, ask that your food be prepared with less salt or no salt, if possible. What foods are recommended? The items listed may not be a complete list. Talk with your dietitian about what dietary choices  are best for you. Grains Whole-grain or whole-wheat bread. Whole-grain or whole-wheat pasta. Brown rice. [place]. Bulgur. Whole-grain and low-sodium cereals. Pita bread. Low-fat, low-sodium crackers. Whole-wheat flour tortillas. Vegetables Fresh or frozen vegetables (raw, steamed, roasted, or grilled). Low-sodium or reduced-sodium tomato and vegetable juice. Low-sodium or reduced-sodium tomato sauce and tomato paste. Low-sodium or reduced-sodium canned vegetables. Fruits All fresh, dried, or frozen fruit. Canned fruit in natural juice (without added sugar). Meat and other protein foods Skinless chicken or [place]. Ground chicken or [place]. Pork with fat trimmed off. Fish and seafood. Egg whites. Dried beans, peas, or lentils. Unsalted nuts, nut butters, and seeds. Unsalted canned beans. Lean cuts of beef with fat trimmed off.  Low-sodium, lean deli meat. Dairy Low-fat (1%) or fat-free (skim) milk. Fat-free, low-fat, or reduced-fat cheeses. Nonfat, low-sodium ricotta or cottage cheese. Low-fat or nonfat yogurt. Low-fat, low-sodium cheese. Fats and oils Soft margarine without trans fats. Vegetable oil. Low-fat, reduced-fat, or light mayonnaise and salad dressings (reduced-sodium). Canola, safflower, olive, soybean, and sunflower oils. Avocado. Seasoning and other foods Herbs. Spices. Seasoning mixes without salt. Unsalted popcorn and pretzels. Fat-free sweets. What foods are not recommended? The items listed may not be a complete list. Talk with your dietitian about what dietary choices are best for you. Grains Baked goods made with fat, such as croissants, muffins, or some breads. Dry pasta or rice meal packs. Vegetables Creamed or fried vegetables. Vegetables in a cheese sauce. Regular canned vegetables (not low-sodium or reduced-sodium). Regular canned tomato sauce and paste (not low-sodium or reduced-sodium). Regular tomato and vegetable juice (not low-sodium or reduced-sodium). Angie Fava. Olives. Fruits Canned fruit in a light or heavy syrup. Fried fruit. Fruit in cream or butter sauce. Meat and other protein foods Fatty cuts of meat. Ribs. Fried meat. Berniece Salines. Sausage. Bologna and other processed lunch meats. Salami. Fatback. Hotdogs. Bratwurst. Salted nuts and seeds. Canned beans with added salt. Canned or smoked fish. Whole eggs or egg yolks. Chicken or [place] with skin. Dairy Whole or 2% milk, cream, and half-and-half. Whole or full-fat cream cheese. Whole-fat or sweetened yogurt. Full-fat cheese. Nondairy creamers. Whipped toppings. Processed cheese and cheese spreads. Fats and oils Butter. Stick margarine. Lard. Shortening. Ghee. Bacon fat. Tropical oils, such as coconut, palm kernel, or palm oil. Seasoning and other foods Salted popcorn and pretzels. Onion salt, garlic salt, seasoned salt, table salt, and sea  salt. Worcestershire sauce. Tartar sauce. Barbecue sauce. Teriyaki sauce. Soy sauce, including reduced-sodium. Steak sauce. Canned and packaged gravies. Fish sauce. Oyster sauce. Cocktail sauce. Horseradish that you find on the shelf. Ketchup. Mustard. Meat flavorings and tenderizers. Bouillon cubes. Hot sauce and Tabasco sauce. Premade or packaged marinades. Premade or packaged taco seasonings. Relishes. Regular salad dressings. Where to find more information:  National Heart, Lung, and [place]: https://wilson-eaton.com/  American Heart Association: www.heart.org Summary  The DASH eating plan is a healthy eating plan that has been shown to reduce high blood pressure (hypertension). It may also reduce your risk for type 2 diabetes, heart disease, and stroke.  With the DASH eating plan, you should limit salt (sodium) intake to 2,300 mg a day. If you have hypertension, you may need to reduce your sodium intake to 1,500 mg a day.  When on the DASH eating plan, aim to eat more fresh fruits and vegetables, whole grains, lean proteins, low-fat dairy, and heart-healthy fats.  Work with your health care provider or diet and nutrition specialist (dietitian) to adjust your eating plan to your individual calorie needs.  This information is not intended to replace advice given to you by your health care provider. Make sure you discuss any questions you have with your health care provider. Document Released: 03/10/2011 Document Revised: 03/14/2016 Document Reviewed: 03/14/2016 Elsevier Interactive Patient Education  2018 [employer].  Hypertension Hypertension, commonly called high blood pressure, is when the force of blood pumping through the arteries is too strong. The arteries are the blood vessels that carry blood from the heart throughout the body. Hypertension forces the heart to work harder to pump blood and may cause arteries to become narrow or stiff. Having untreated or uncontrolled hypertension  can cause heart attacks, strokes, kidney disease, and other problems. A blood pressure reading consists of a higher number over a lower number. Ideally, your blood pressure should be below 120/80. The first ("top") number is called the systolic pressure. It is a measure of the pressure in your arteries as your heart beats. The second ("bottom") number is called the diastolic pressure. It is a measure of the pressure in your arteries as the heart relaxes. What are the causes? The cause of this condition is not known. What increases the risk? Some risk factors for high blood pressure are under your control. Others are not. Factors you can change  Smoking.  Having type 2 diabetes mellitus, high cholesterol, or both.  Not getting enough exercise or physical activity.  Being overweight.  Having too much fat, sugar, calories, or salt (sodium) in your diet.  Drinking too much alcohol. Factors that are difficult or impossible to change  Having chronic kidney disease.  Having a family history of high blood pressure.  Age. Risk increases with age.  Race. You may be at higher risk if you are African-American.  Gender. Men are at higher risk than women before age 64. After age 48, women are at higher risk than men.  Having obstructive sleep apnea.  Stress. What are the signs or symptoms? Extremely high blood pressure (hypertensive crisis) may cause:  Headache.  Anxiety.  Shortness of breath.  Nosebleed.  Nausea and vomiting.  Severe chest pain.  Jerky movements you cannot control (seizures).  How is this diagnosed? This condition is diagnosed by measuring your blood pressure while you are seated, with your arm resting on a surface. The cuff of the blood pressure monitor will be placed directly against the skin of your upper arm at the level of your heart. It should be measured at least twice using the same arm. Certain conditions can cause a difference in blood pressure  between your right and left arms. Certain factors can cause blood pressure readings to be lower or higher than normal (elevated) for a short period of time:  When your blood pressure is higher when you are in a health care provider's office than when you are at home, this is called white coat hypertension. Most people with this condition do not need medicines.  When your blood pressure is higher at home than when you are in a health care provider's office, this is called masked hypertension. Most people with this condition may need medicines to control blood pressure.  If you have a high blood pressure reading during one visit or you have normal blood pressure with other risk factors:  You may be asked to return on a different day to have your blood pressure checked again.  You may be asked to monitor your blood pressure at home for 1 week or longer.  If you are diagnosed with  hypertension, you may have other blood or imaging tests to help your health care provider understand your overall risk for other conditions. How is this treated? This condition is treated by making healthy lifestyle changes, such as eating healthy foods, exercising more, and reducing your alcohol intake. Your health care provider may prescribe medicine if lifestyle changes are not enough to get your blood pressure under control, and if:  Your systolic blood pressure is above 130.  Your diastolic blood pressure is above 80.  Your personal target blood pressure may vary depending on your medical conditions, your age, and other factors. Follow these instructions at home: Eating and drinking  Eat a diet that is high in fiber and potassium, and low in sodium, added sugar, and fat. An example eating plan is called the DASH (Dietary Approaches to Stop Hypertension) diet. To eat this way: ? Eat plenty of fresh fruits and vegetables. Try to fill half of your plate at each meal with fruits and vegetables. ? Eat whole grains,  such as whole wheat pasta, brown rice, or whole grain bread. Fill about one quarter of your plate with whole grains. ? Eat or drink low-fat dairy products, such as skim milk or low-fat yogurt. ? Avoid fatty cuts of meat, processed or cured meats, and poultry with skin. Fill about one quarter of your plate with lean proteins, such as fish, chicken without skin, beans, eggs, and tofu. ? Avoid premade and processed foods. These tend to be higher in sodium, added sugar, and fat.  Reduce your daily sodium intake. Most people with hypertension should eat less than 1,500 mg of sodium a day.  Limit alcohol intake to no more than 1 drink a day for nonpregnant women and 2 drinks a day for men. One drink equals 12 oz of beer, 5 oz of wine, or 1 oz of hard liquor. Lifestyle  Work with your health care provider to maintain a healthy body weight or to lose weight. Ask what an ideal weight is for you.  Get at least 30 minutes of exercise that causes your heart to beat faster (aerobic exercise) most days of the week. Activities may include walking, swimming, or biking.  Include exercise to strengthen your muscles (resistance exercise), such as pilates or lifting weights, as part of your weekly exercise routine. Try to do these types of exercises for 30 minutes at least 3 days a week.  Do not use any products that contain nicotine or tobacco, such as cigarettes and e-cigarettes. If you need help quitting, ask your health care provider.  Monitor your blood pressure at home as told by your health care provider.  Keep all follow-up visits as told by your health care provider. This is important. Medicines  Take over-the-counter and prescription medicines only as told by your health care provider. Follow directions carefully. Blood pressure medicines must be taken as prescribed.  Do not skip doses of blood pressure medicine. Doing this puts you at risk for problems and can make the medicine less  effective.  Ask your health care provider about side effects or reactions to medicines that you should watch for. Contact a health care provider if:  You think you are having a reaction to a medicine you are taking.  You have headaches that keep coming back (recurring).  You feel dizzy.  You have swelling in your ankles.  You have trouble with your vision. Get help right away if:  You develop a severe headache or confusion.  You have  unusual weakness or numbness.  You feel faint.  You have severe pain in your chest or abdomen.  You vomit repeatedly.  You have trouble breathing. Summary  Hypertension is when the force of blood pumping through your arteries is too strong. If this condition is not controlled, it may put you at risk for serious complications.  Your personal target blood pressure may vary depending on your medical conditions, your age, and other factors. For most people, a normal blood pressure is less than 120/80.  Hypertension is treated with lifestyle changes, medicines, or a combination of both. Lifestyle changes include weight loss, eating a healthy, low-sodium diet, exercising more, and limiting alcohol. This information is not intended to replace advice given to you by your health care provider. Make sure you discuss any questions you have with your health care provider. Document Released: 03/21/2005 Document Revised: 02/17/2016 Document Reviewed: 02/17/2016 Elsevier Interactive Patient Education  [employer].

## 2017-08-15 NOTE — Telephone Encounter (Unsigned)
Copied from [place] [id number]. Topic: Quick Communication - See Telephone Encounter >> Aug 15, 2017  2:35 PM Neva Seat wrote: Pt had chest Xrays today.  Pt is asking if TB can be looked at from today's Xrays? Please call pt back to discuss.

## 2017-08-16 ENCOUNTER — Encounter (INDEPENDENT_AMBULATORY_CARE_PROVIDER_SITE_OTHER): Payer: Self-pay

## 2017-08-16 NOTE — Telephone Encounter (Signed)
It looks like patient may want Rx for Debrox drops for her ears- per note

## 2017-08-16 NOTE — Telephone Encounter (Signed)
Its over the counter Debrox she can buy it without Rx   [facility]

## 2017-08-16 NOTE — Telephone Encounter (Signed)
Please advise 

## 2017-08-16 NOTE — Telephone Encounter (Signed)
Left voicemail for patient that she can buy Debrox over the counter and to call the office if she has any questions or concerns.

## 2017-08-30 ENCOUNTER — Other Ambulatory Visit: Payer: Self-pay

## 2017-08-30 DIAGNOSIS — Z1211 Encounter for screening for malignant neoplasm of colon: Secondary | ICD-10-CM

## 2017-09-04 ENCOUNTER — Ambulatory Visit: Payer: 59 | Admitting: Family Medicine

## 2017-09-04 ENCOUNTER — Ambulatory Visit: Payer: Self-pay

## 2017-09-04 ENCOUNTER — Encounter: Payer: Self-pay | Admitting: Family Medicine

## 2017-09-04 VITALS — BP 130/88 | HR 62 | Temp 98.4°F | Wt 177.0 lb

## 2017-09-04 DIAGNOSIS — W57XXXA Bitten or stung by nonvenomous insect and other nonvenomous arthropods, initial encounter: Secondary | ICD-10-CM

## 2017-09-04 DIAGNOSIS — L299 Pruritus, unspecified: Secondary | ICD-10-CM

## 2017-09-04 MED ORDER — HYDROXYZINE HCL 25 MG PO TABS: 25.0000 mg | ORAL_TABLET | Freq: Three times a day (TID) | ORAL | 0 refills | Status: DC | PRN

## 2017-09-04 MED ORDER — TRIAMCINOLONE ACETONIDE 0.1 % EX CREA
1.0000 | TOPICAL_CREAM | Freq: Two times a day (BID) | CUTANEOUS | 0 refills | Status: DC
Start: 2017-09-04 — End: 2017-11-30

## 2017-09-04 NOTE — Patient Instructions (Signed)
Bedbugs Bedbugs are tiny bugs that live in and around beds. They stay hidden during the day, and they come out at night and bite. Bedbugs need blood to live and grow. Where are bedbugs found? Bedbugs can be found anywhere, whether a place is clean or dirty. They are most often found in places where many people come and go, such as hotels, shelters, dorms, and health care settings. It is also common for them to be found in homes where there are many birds or bats nearby. What are bedbug bites like? A bedbug bite leaves a small red bump with a darker red dot in the middle. The bump may appear soon after a person is bitten or a day or more later. Bedbug bites usually do not hurt, but they may itch. Most people do not need treatment for bedbug bites. The bumps usually go away on their own in a few days. How do I check for bedbugs? Bedbugs are reddish-brown, oval, and flat. They range in size from 1 mm to 7 mm and they cannot fly. Look for bedbugs in these places:  On mattresses, bed frames, headboards, and box springs.  On drapes and curtains in bedrooms.  Under carpeting in bedrooms.  Behind electrical outlets.  Behind any wallpaper that is peeling.  Inside luggage.  Also look for black or red spots or stains on or near the bed. Stains can come from bedbugs that have been crushed or from bedbug waste. What should I do if I find bedbugs? When Traveling If you find bedbugs while traveling, check all of your possessions carefully before you bring them into your home. Consider throwing away anything that has bedbugs on it. At Home If you find bedbugs at home, your bedroom may need to be treated by a pest control expert. You may also need to throw away mattresses or luggage. To help keep bedbugs from coming back, consider taking these actions:  Put a plastic cover over your mattress.  Wash your clothes and bedding in water that is hotter than 120F (48.9C) and dry them on a hot setting.  Bedbugs are killed by high temperatures.  Vacuum often around the bed and in all of the cracks and crevices where the bugs might hide.  Carefully check all used furniture, bedding, or clothes that you bring into your home.  Eliminate bird nests and bat roosts that are near your home.  In Your Bed If you find bedbugs in your bed, consider wearing pajamas that have long sleeves and pant legs. Bedbugs usually bite areas of the skin that are not covered. This information is not intended to replace advice given to you by your health care provider. Make sure you discuss any questions you have with your health care provider. Document Released: 04/23/2010 Document Revised: 08/27/2015 Document Reviewed: 03/17/2014 Elsevier Interactive Patient Education  2018 Elsevier Inc.  

## 2017-09-04 NOTE — Progress Notes (Signed)
Subjective:    Patient ID: Katelyn Baldwin, female    DOB: Dec 07, 1965, 52 y.o.   MRN: [id number]  No chief complaint on file.   HPI Patient was seen today for acute concern, typically followed by Dr. Terese Door.  Pt endorses red spots on her legs and upper extremities with intense itching after sleeping in the guest room at her mother's house in [place].  Pt's mother states she had similar symptoms after sleeping in the guest room.  It was later discovered the pt's mother had bedbugs.  Pt states she threw away the clothes she wore.  Pt has tired benadryl and cortisone 10 cream with no relief.  Past Medical History:  Diagnosis Date  . Depression   . Fibroid, uterine    [place] 01/03/11   . Headache   . Hypertension     Allergies  Allergen Reactions  . Shrimp [Shellfish Allergy]     ROS General: Denies fever, chills, night sweats, changes in weight, changes in appetite HEENT: Denies headaches, ear pain, changes in vision, rhinorrhea, sore throat CV: Denies CP, palpitations, SOB, orthopnea Pulm: Denies SOB, cough, wheezing GI: Denies abdominal pain, nausea, vomiting, diarrhea, constipation GU: Denies dysuria, hematuria, frequency, vaginal discharge Msk: Denies muscle cramps, joint pains Neuro: Denies weakness, numbness, tingling Skin: Denies rashes, bruising   +pruritic red spots all over Psych: Denies depression, anxiety, hallucinations     Objective:    Blood pressure 130/88, pulse 62, temperature 98.4 F (36.9 C), temperature source Oral, weight 177 lb (80.3 kg), last menstrual period 02/21/2017, SpO2 98 %.   Gen. Pleasant, well-nourished, in no distress, normal affect   HEENT: Crouch/AT, face symmetric, no scleral icterus, PERRLA, nares patent without drainage Lungs: no accessory muscle use Cardiovascular: RRR, no peripheral edema Neuro:  A&Ox3, CN II-XII intact, normal gait Skin:  Warm, dry, intact.  Erythematous papules on UEs, LEs, and L upper back.  No serpiginous lesions  or burrows in web spaces of fingers.  No increased warmth or induration noted   Wt Readings from Last 3 Encounters:  09/04/17 177 lb (80.3 kg)  08/15/17 178 lb 6.4 oz (80.9 kg)  07/27/17 171 lb (77.6 kg)    Lab Results  Component Value Date   WBC 4.6 07/27/2017   HGB 12.5 07/27/2017   HCT 36.6 07/27/2017   PLT 270 07/27/2017   GLUCOSE 96 07/27/2017   NA 136 07/27/2017   K 3.6 07/27/2017   CL 101 07/27/2017   CREATININE 0.72 07/27/2017   BUN 13 07/27/2017   CO2 26 07/27/2017    Assessment/Plan:  Pruritus  -likely 2/2 bed bug bites -pt advised to avoid scratching to prevent bacterial infection - Plan: hydrOXYzine (ATARAX/VISTARIL) 25 MG tablet, triamcinolone cream (KENALOG) 0.1 %  Bedbug bite, initial encounter -given handout  F/u w/ pcp prn   Grier Mitts, MD

## 2017-09-04 NOTE — Telephone Encounter (Signed)
Pt c/o generalized quarter sized red rash with a head to them ("looks like pus") The rash is located to face, neck, back, arms and legs. Rash began 2 days ago and is itchy. Pt has tried benadryl and cortisone cream without effect. No available appt at [facility], [place] or [place] made with Dr. Volanda Napoleon today.  Care advice given per protocol.  Reason for Disposition . SEVERE itching (i.e., interferes with sleep, normal activities or school)  Answer Assessment - Initial Assessment Questions 1. APPEARANCE of RASH: "Describe the rash." (e.g., spots, blisters, raised areas, skin peeling, scaly)     Raised area looks like a bleb spot in middle wike pus 2. SIZE: "How big are the spots?" (e.g., tip of pen, eraser, coin; inches, centimeters)     Quarter size 3. LOCATION: "Where is the rash located?"     Arms, face, neck, legs, back 4. COLOR: "What color is the rash?" (Note: It is difficult to assess rash color in people with darker-colored skin. When this situation occurs, simply ask the caller to describe what they see.)     red 5. ONSET: "When did the rash begin?"     2 days ago 6. FEVER: "Do you have a fever?" If so, ask: "What is your temperature, how was it measured, and when did it start?"     no 7. ITCHING: "Does the rash itch?" If so, ask: "How bad is the itch?" (Scale 1-10; or mild, moderate, severe)     yes 8. CAUSE: "What do you think is causing the rash?"     Pt is not sure 9. MEDICATION FACTORS: "Have you started any new medications within the last 2 weeks?" (e.g., antibiotics)      no 10. OTHER SYMPTOMS: "Do you have any other symptoms?" (e.g., dizziness, headache, sore throat, joint pain)       Had 1 episode nausea 11. PREGNANCY: "Is there any chance you are pregnant?" "When was your last menstrual period?"       No LMP: 3 months ago  Protocols used: RASH OR REDNESS - [facility]

## 2017-09-08 ENCOUNTER — Other Ambulatory Visit (INDEPENDENT_AMBULATORY_CARE_PROVIDER_SITE_OTHER): Payer: 59

## 2017-09-08 DIAGNOSIS — I10 Essential (primary) hypertension: Secondary | ICD-10-CM | POA: Diagnosis not present

## 2017-09-08 DIAGNOSIS — Z1159 Encounter for screening for other viral diseases: Secondary | ICD-10-CM

## 2017-09-08 DIAGNOSIS — Z0184 Encounter for antibody response examination: Secondary | ICD-10-CM

## 2017-09-08 DIAGNOSIS — Z1329 Encounter for screening for other suspected endocrine disorder: Secondary | ICD-10-CM

## 2017-09-08 DIAGNOSIS — E559 Vitamin D deficiency, unspecified: Secondary | ICD-10-CM

## 2017-09-08 DIAGNOSIS — N926 Irregular menstruation, unspecified: Secondary | ICD-10-CM

## 2017-09-08 NOTE — Addendum Note (Signed)
Addended by: Arby Barrette on: 09/08/2017 07:59 AM   Modules accepted: Orders

## 2017-09-11 ENCOUNTER — Encounter: Payer: Self-pay | Admitting: Internal Medicine

## 2017-09-11 LAB — HEPATIC FUNCTION PANEL
AG Ratio: 1.1 (calc) (ref 1.0–2.5)
ALKALINE PHOSPHATASE (APISO): 49 U/L (ref 33–130)
ALT: 13 U/L (ref 6–29)
AST: 16 U/L (ref 10–35)
Albumin: 4.1 g/dL (ref 3.6–5.1)
BILIRUBIN DIRECT: 0.1 mg/dL (ref 0.0–0.2)
BILIRUBIN TOTAL: 0.3 mg/dL (ref 0.2–1.2)
Globulin: 3.8 g/dL (calc) — ABNORMAL HIGH (ref 1.9–3.7)
Indirect Bilirubin: 0.2 mg/dL (calc) (ref 0.2–1.2)
Total Protein: 7.9 g/dL (ref 6.1–8.1)

## 2017-09-11 LAB — LIPID PANEL
Cholesterol: 251 mg/dL — ABNORMAL HIGH (ref <200)
HDL: 65 mg/dL (ref >50)
LDL Cholesterol (Calc): 167 mg/dL (calc) — ABNORMAL HIGH
Non-HDL Cholesterol (Calc): 186 mg/dL (calc) — ABNORMAL HIGH (ref <130)
Total CHOL/HDL Ratio: 3.9 (calc) (ref <5.0)
Triglycerides: 83 mg/dL (ref <150)

## 2017-09-11 LAB — T4, FREE: FREE T4: 1 ng/dL (ref 0.8–1.8)

## 2017-09-11 LAB — VITAMIN D 25 HYDROXY (VIT D DEFICIENCY, FRACTURES): Vit D, 25-Hydroxy: 26 ng/mL — ABNORMAL LOW (ref 30–100)

## 2017-09-11 LAB — MEASLES/MUMPS/RUBELLA IMMUNITY
RUBELLA: 6.16 {index}
Rubeola IgG: <25 AU/mL — ABNORMAL LOW

## 2017-09-11 LAB — FOLLICLE STIMULATING HORMONE: FSH: 88.4 m[IU]/mL

## 2017-09-11 LAB — TSH: TSH: 1.7 mIU/L

## 2017-09-11 LAB — HEPATITIS B SURFACE ANTIBODY, QUANTITATIVE: HEPATITIS B-POST: 8 m[IU]/mL — AB (ref >=10)

## 2017-09-13 ENCOUNTER — Other Ambulatory Visit: Payer: Self-pay | Admitting: Internal Medicine

## 2017-09-13 DIAGNOSIS — E785 Hyperlipidemia, unspecified: Secondary | ICD-10-CM

## 2017-09-13 MED ORDER — ATORVASTATIN CALCIUM 20 MG PO TABS: 20.0000 mg | ORAL_TABLET | Freq: Every day | ORAL | 3 refills | Status: AC

## 2017-09-15 ENCOUNTER — Ambulatory Visit: Payer: No Typology Code available for payment source | Admitting: Anesthesiology

## 2017-09-15 ENCOUNTER — Encounter
Admission: RE | Disposition: A | Discharge status: Home / Self Care | Payer: Self-pay | Source: Ambulatory Visit | Attending: Gastroenterology

## 2017-09-15 ENCOUNTER — Ambulatory Visit: Payer: 59 | Admitting: Internal Medicine

## 2017-09-15 ENCOUNTER — Ambulatory Visit
Admission: RE | Admit: 2017-09-15 | Discharge: 2017-09-15 | Disposition: A | Discharge status: Home / Self Care | Payer: No Typology Code available for payment source | Source: Ambulatory Visit | Attending: Gastroenterology | Admitting: Gastroenterology

## 2017-09-15 ENCOUNTER — Encounter: Payer: Self-pay | Admitting: *Deleted

## 2017-09-15 DIAGNOSIS — F329 Major depressive disorder, single episode, unspecified: Secondary | ICD-10-CM | POA: Insufficient documentation

## 2017-09-15 DIAGNOSIS — Z800 Family history of malignant neoplasm of digestive organs: Secondary | ICD-10-CM | POA: Diagnosis not present

## 2017-09-15 DIAGNOSIS — D125 Benign neoplasm of sigmoid colon: Secondary | ICD-10-CM

## 2017-09-15 DIAGNOSIS — I10 Essential (primary) hypertension: Secondary | ICD-10-CM | POA: Diagnosis not present

## 2017-09-15 DIAGNOSIS — Z91013 Allergy to seafood: Secondary | ICD-10-CM | POA: Diagnosis not present

## 2017-09-15 DIAGNOSIS — K635 Polyp of colon: Secondary | ICD-10-CM | POA: Insufficient documentation

## 2017-09-15 DIAGNOSIS — Z1211 Encounter for screening for malignant neoplasm of colon: Secondary | ICD-10-CM | POA: Diagnosis not present

## 2017-09-15 DIAGNOSIS — Z79899 Other long term (current) drug therapy: Secondary | ICD-10-CM | POA: Diagnosis not present

## 2017-09-15 HISTORY — PX: COLONOSCOPY WITH PROPOFOL: SHX5780

## 2017-09-15 LAB — POCT PREGNANCY, URINE: Preg Test, Ur: NEGATIVE

## 2017-09-15 LAB — HM COLONOSCOPY

## 2017-09-15 SURGERY — COLONOSCOPY WITH PROPOFOL
Anesthesia: General

## 2017-09-15 MED ORDER — PROPOFOL 10 MG/ML IV BOLUS
INTRAVENOUS | Status: AC
  Filled 2017-09-15: qty 20

## 2017-09-15 MED ORDER — PROPOFOL 10 MG/ML IV BOLUS
INTRAVENOUS | Status: DC | PRN
  Administered 2017-09-15: 30 mg via INTRAVENOUS

## 2017-09-15 MED ORDER — FENTANYL CITRATE (PF) 100 MCG/2ML IJ SOLN
INTRAMUSCULAR | Status: DC | PRN
  Administered 2017-09-15: 50 ug via INTRAVENOUS

## 2017-09-15 MED ORDER — MIDAZOLAM HCL 2 MG/2ML IJ SOLN
INTRAMUSCULAR | Status: AC
  Filled 2017-09-15: qty 2

## 2017-09-15 MED ORDER — MIDAZOLAM HCL 2 MG/2ML IJ SOLN
INTRAMUSCULAR | Status: DC | PRN
  Administered 2017-09-15: 2 mg via INTRAVENOUS

## 2017-09-15 MED ORDER — PROPOFOL 500 MG/50ML IV EMUL
INTRAVENOUS | Status: DC | PRN
Start: 2017-09-15 — End: 2017-09-15
  Administered 2017-09-15: 120 ug/kg/min via INTRAVENOUS

## 2017-09-15 MED ORDER — SODIUM CHLORIDE 0.9 % IV SOLN
INTRAVENOUS | Status: DC
  Administered 2017-09-15 (×2): via INTRAVENOUS

## 2017-09-15 MED ORDER — FENTANYL CITRATE (PF) 100 MCG/2ML IJ SOLN
INTRAMUSCULAR | Status: AC
  Filled 2017-09-15: qty 2

## 2017-09-15 NOTE — H&P (Signed)
Vonda Antigua, MD [address] [place], [place], [place], [address] [address], [place], [place], [place], [address] Phone: [phone]  Fax: [phone]  Primary Care Physician:  McLean-Scocuzza, Nino Glow, MD   Pre-Procedure History & Physical: HPI:  Katelyn Baldwin is a 52 y.o. female is here for a colonoscopy.   Past Medical History:  Diagnosis Date  . Depression   . Fibroid, uterine    [place] 01/03/11   . Headache   . Hypertension     Past Surgical History:  Procedure Laterality Date  . DILATION AND CURETTAGE OF UTERUS     tubal pregnancy     Prior to Admission medications   Medication Sig Start Date End Date Taking? Authorizing Provider  amLODipine (NORVASC) 2.5 MG tablet Take 1 tablet (2.5 mg total) by mouth daily. May take another dose if >140/>90 08/15/17  Yes McLean-Scocuzza, Nino Glow, MD  atorvastatin (LIPITOR) 20 MG tablet Take 1 tablet (20 mg total) by mouth daily at 6 PM. 09/13/17  Yes McLean-Scocuzza, Nino Glow, MD  hydrochlorothiazide (MICROZIDE) 12.5 MG capsule Take 1 capsule by mouth daily. 07/21/17  Yes [provider]  hydrOXYzine (ATARAX/VISTARIL) 25 MG tablet Take 1 tablet (25 mg total) by mouth 3 (three) times daily as needed for itching. 09/04/17  Yes Billie Ruddy, MD  triamcinolone cream (KENALOG) 0.1 % Apply 1 application topically 2 (two) times daily. 09/04/17  Yes Billie Ruddy, MD    Allergies as of 08/31/2017 - Review Complete 08/15/2017  Allergen Reaction Noted  . Shrimp [shellfish allergy]  08/15/2017    Family History  Problem Relation Age of Onset  . Hyperlipidemia Mother   . Cancer Father        lung smoker   . Alcohol abuse Father   . Arthritis Sister   . Depression Sister   . Alcohol abuse Daughter   . Cancer Maternal Aunt        uterine  . Cancer Maternal Grandmother        ovarian  . Cancer Maternal Grandfather        colon or prostate cancer   . Alcohol abuse Paternal Grandfather   . Breast cancer Neg Hx      Social History   Socioeconomic History  . Marital status: Widowed    Spouse name: Not on file  . Number of children: Not on file  . Years of education: Not on file  . Highest education level: Not on file  Occupational History  . Not on file  Social Needs  . Financial resource strain: Not on file  . Food insecurity:    Worry: Not on file    Inability: Not on file  . Transportation needs:    Medical: Not on file    Non-medical: Not on file  Tobacco Use  . Smoking status: Never Smoker  . Smokeless tobacco: Never Used  Substance and Sexual Activity  . Alcohol use: No    Frequency: Never  . Drug use: No  . Sexual activity: Not Currently  Lifestyle  . Physical activity:    Days per week: Not on file    Minutes per session: Not on file  . Stress: Not on file  Relationships  . Social connections:    Talks on phone: Not on file    Gets together: Not on file    Attends religious service: Not on file    Active member of club or organization: Not on file    Attends meetings  of clubs or organizations: Not on file    Relationship status: Not on file  . Intimate partner violence:    Fear of current or ex partner: Not on file    Emotionally abused: Not on file    Physically abused: Not on file    Forced sexual activity: Not on file  Other Topics Concern  . Not on file  Social History Narrative   Widow husband died age 59 pancreatitic cancer    3 kids ages 89, 78, 6 as of 08/2017   Infant classroom teacher    Lives in [place] and [place]    2 year degree    Divorced now single    Never smoker    No guns, wears seat belt     Review of Systems: See HPI, otherwise negative ROS  Physical Exam: BP (!) 146/92   Pulse 79   Resp 18   Ht 4\' 11"  (1.499 m)   Wt 177 lb (80.3 kg)   LMP 02/21/2017   SpO2 100%   BMI 35.75 kg/m  General:   Alert,  pleasant and cooperative in NAD Head:  Normocephalic and atraumatic. Neck:  Supple; no masses or thyromegaly. Lungs:   Clear throughout to auscultation, normal respiratory effort.    Heart:  +S1, +S2, Regular rate and rhythm, No edema. Abdomen:  Soft, nontender and nondistended. Normal bowel sounds, without guarding, and without rebound.   Neurologic:  Alert and  oriented x4;  grossly normal neurologically.  Impression/Plan: Katelyn Baldwin is here for a colonoscopy to be performed for average risk screening.  Risks, benefits, limitations, and alternatives regarding  colonoscopy have been reviewed with the patient.  Questions have been answered.  All parties agreeable.   Virgel Manifold, MD  09/15/2017, 10:08 AM

## 2017-09-15 NOTE — Anesthesia Procedure Notes (Signed)
Date/Time: 09/15/2017 11:10 AM Performed by: Allean Found, CRNA Pre-anesthesia Checklist: Patient identified, Emergency Drugs available, Suction available, Patient being monitored and Timeout performed Patient Re-evaluated:Patient Re-evaluated prior to induction Oxygen Delivery Method: Nasal cannula Placement Confirmation: positive ETCO2

## 2017-09-15 NOTE — Op Note (Signed)
[facility] Gastroenterology Patient Name: Katelyn Baldwin Procedure Date: 09/15/2017 11:06 AM MRN: [id number] Account #: [id number] Date of Birth: Dec 17, 1965 Admit Type: Outpatient Age: 52 Room: [facility] ENDO ROOM 2 Gender: Female Note Status: Finalized Procedure:            Colonoscopy Indications:          Screening for colorectal malignant neoplasm Providers:            Myya Meenach B. Bonna Gains MD, MD Medicines:            Monitored Anesthesia Care Complications:        No immediate complications. Procedure:            Pre-Anesthesia Assessment:                       - ASA Grade Assessment: II - A patient with mild                        systemic disease.                       - Prior to the procedure, a History and Physical was                        performed, and patient medications, allergies and                        sensitivities were reviewed. The patient's tolerance of                        previous anesthesia was reviewed.                       - The risks and benefits of the procedure and the                        sedation options and risks were discussed with the                        patient. All questions were answered and informed                        consent was obtained.                       - Patient identification and proposed procedure were                        verified prior to the procedure by the physician, the                        nurse, the anesthesiologist, the anesthetist and the                        technician. The procedure was verified in the procedure                        room.                       After obtaining informed consent, the colonoscope was  passed under direct vision. Throughout the procedure,                        the patient's blood pressure, pulse, and oxygen                        saturations were monitored continuously. The                        Colonoscope was introduced through the  anus and                        advanced to the the cecum, identified by appendiceal                        orifice and ileocecal valve. The colonoscopy was                        performed with ease. The patient tolerated the                        procedure well. The quality of the bowel preparation                        was good. Findings:      The perianal and digital rectal examinations were normal.      A 3 mm polyp was found in the sigmoid colon. The polyp was sessile. The       polyp was removed with a cold biopsy forceps. Resection and retrieval       were complete.      The exam was otherwise without abnormality.      The rectum, sigmoid colon, descending colon, transverse colon, ascending       colon and cecum appeared normal.      The retroflexed view of the distal rectum and anal verge was normal and       showed no anal or rectal abnormalities. Impression:           - One 3 mm polyp in the sigmoid colon, removed with a                        cold biopsy forceps. Resected and retrieved.                       - The examination was otherwise normal.                       - The rectum, sigmoid colon, descending colon,                        transverse colon, ascending colon and cecum are normal.                       - The distal rectum and anal verge are normal on                        retroflexion view. Recommendation:       - Discharge patient to home (with escort).                       -  Advance diet as tolerated.                       - Continue present medications.                       - Await pathology results.                       - The findings and recommendations were discussed with                        the patient.                       - The findings and recommendations were discussed with                        the patient's family.                       - Return to primary care physician as previously                        scheduled.                        - If the pathology report reveals adenomatous tissue,                        then repeat the colonoscopy for surveillance in 5 years.                       - If the pathology report indicates hyperplastic polyp,                        then repeat colonoscopy for screening purposes in 10                        years. Procedure Code(s):    --- Professional ---                       [phone], Colonoscopy, flexible; with biopsy, single or                        multiple Diagnosis Code(s):    --- Professional ---                       Z12.11, Encounter for screening for malignant neoplasm                        of colon                       D12.5, Benign neoplasm of sigmoid colon CPT copyright 2017 American Medical Association. All rights reserved. The codes documented in this report are preliminary and upon coder review may  be revised to meet current compliance requirements.  Vonda Antigua, MD Margretta Sidle B. Bonna Gains MD, MD 09/15/2017 11:45:03 AM This report has been signed electronically. Number of Addenda: 0 Note Initiated On: 09/15/2017 11:06 AM Scope Withdrawal Time: 0 hours 19 minutes 1 second  Total Procedure Duration: 0 hours 29 minutes 46 seconds  Estimated Blood  Loss: Estimated blood loss: none.      [facility]

## 2017-09-15 NOTE — Anesthesia Preprocedure Evaluation (Signed)
Anesthesia Evaluation  Patient identified by MRN, date of birth, ID band Patient awake    Reviewed: Allergy & Precautions, H&P , NPO status , Patient's Chart, lab work & pertinent test results, reviewed documented beta blocker date and time   Airway Mallampati: II   Neck ROM: full    Dental  (+) Poor Dentition   Pulmonary neg pulmonary ROS,    Pulmonary exam normal        Cardiovascular Exercise Tolerance: Good hypertension, On Medications negative cardio ROS Normal cardiovascular exam Rhythm:regular Rate:Normal     Neuro/Psych  Headaches, negative neurological ROS  negative psych ROS   GI/Hepatic negative GI ROS, Neg liver ROS,   Endo/Other  negative endocrine ROS  Renal/GU negative Renal ROS  negative genitourinary   Musculoskeletal   Abdominal   Peds  Hematology negative hematology ROS (+)   Anesthesia Other Findings Past Medical History: No date: Depression 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, uterine     Comment:  [place] 01/03/11  No date: Headache No date: Hypertension Past Surgical History: No date: DILATION AND CURETTAGE OF UTERUS     Comment:  tubal pregnancy  BMI    Body Mass Index:  35.75 kg/m     Reproductive/Obstetrics negative OB ROS                             Anesthesia Physical Anesthesia Plan  ASA: II  Anesthesia Plan: General   Post-op Pain Management:    Induction:   PONV Risk Score and Plan:   Airway Management Planned:   Additional Equipment:   Intra-op Plan:   Post-operative Plan:   Informed Consent: I have reviewed the patients History and Physical, chart, labs and discussed the procedure including the risks, benefits and alternatives for the proposed anesthesia with the patient or authorized representative who has indicated his/her understanding and acceptance.   Dental Advisory Given  Plan Discussed with: CRNA  Anesthesia Plan Comments:          Anesthesia Quick Evaluation

## 2017-09-15 NOTE — Anesthesia Post-op Follow-up Note (Signed)
Anesthesia QCDR form completed.        

## 2017-09-15 NOTE — Transfer of Care (Signed)
Immediate Anesthesia Transfer of Care Note  Patient: Katelyn Baldwin  Procedure(s) Performed: COLONOSCOPY WITH PROPOFOL (N/A )  Patient Location: PACU  Anesthesia Type:General  Level of Consciousness: alert   Airway & Oxygen Therapy: Patient Spontanous Breathing and Patient connected to nasal cannula oxygen  Post-op Assessment: Report given to RN and Post -op Vital signs reviewed and stable  Post vital signs: Reviewed and stable  Last Vitals:  Vitals Value Taken Time  BP 125/82 09/15/2017 11:53 AM  Temp 36.1 C 09/15/2017 11:53 AM  Pulse 82 09/15/2017 11:56 AM  Resp 17 09/15/2017 11:56 AM  SpO2 100 % 09/15/2017 11:56 AM  Vitals shown include unvalidated device data.  Last Pain:  Vitals:   09/15/17 1153  TempSrc: Tympanic  PainSc: 0-No pain         Complications: No apparent anesthesia complications

## 2017-09-16 ENCOUNTER — Encounter: Payer: Self-pay | Admitting: Internal Medicine

## 2017-09-17 NOTE — Anesthesia Postprocedure Evaluation (Signed)
Anesthesia Post Note  Patient: Katelyn Baldwin  Procedure(s) Performed: COLONOSCOPY WITH PROPOFOL (N/A )  Patient location during evaluation: PACU Anesthesia Type: General Level of consciousness: awake and alert Pain management: pain level controlled Vital Signs Assessment: post-procedure vital signs reviewed and stable Respiratory status: spontaneous breathing, nonlabored ventilation, respiratory function stable and patient connected to nasal cannula oxygen Cardiovascular status: blood pressure returned to baseline and stable Postop Assessment: no apparent nausea or vomiting Anesthetic complications: no     Last Vitals:  Vitals:   09/15/17 1213 09/15/17 1223  BP: 128/88 133/87  Pulse:    Resp: 20 19  Temp:    SpO2:      Last Pain:  Vitals:   09/15/17 1223  TempSrc:   PainSc: 0-No pain                 Molli Barrows

## 2017-09-18 LAB — SURGICAL PATHOLOGY

## 2017-09-18 NOTE — Telephone Encounter (Signed)
Pt made appt. For 09/19/17 with Gregary Signs.

## 2017-09-19 ENCOUNTER — Encounter: Payer: Self-pay | Admitting: Gastroenterology

## 2017-09-19 ENCOUNTER — Encounter: Payer: Self-pay | Admitting: Family Medicine

## 2017-09-19 ENCOUNTER — Ambulatory Visit (INDEPENDENT_AMBULATORY_CARE_PROVIDER_SITE_OTHER): Payer: 59 | Admitting: Family Medicine

## 2017-09-19 VITALS — BP 134/86 | HR 75 | Temp 98.8°F | Resp 16 | Wt 176.2 lb

## 2017-09-19 DIAGNOSIS — J0140 Acute pansinusitis, unspecified: Secondary | ICD-10-CM | POA: Diagnosis not present

## 2017-09-19 MED ORDER — AMOXICILLIN-POT CLAVULANATE 875-125 MG PO TABS: 1.0000 | ORAL_TABLET | Freq: Two times a day (BID) | ORAL | 0 refills | Status: DC

## 2017-09-19 NOTE — Patient Instructions (Signed)
Please drink plenty of water enough for your urine to be pale yellow or clear. You may use Tylenol 325 mg every 6 hours as needed. Mucinex DM can be used for cough. Follow-up for evaluation if your symptoms do not improve in 3-4 days, worsen, or you develop a fever greater than 100.  Saline nasal rinses can be used.   Sinusitis, Adult Sinusitis is soreness and inflammation of your sinuses. Sinuses are hollow spaces in the bones around your face. They are located:  Around your eyes.  In the middle of your forehead.  Behind your nose.  In your cheekbones.  Your sinuses and nasal passages are lined with a stringy fluid (mucus). Mucus normally drains out of your sinuses. When your nasal tissues get inflamed or swollen, the mucus can get trapped or blocked so air cannot flow through your sinuses. This lets bacteria, viruses, and funguses grow, and that leads to infection. Follow these instructions at home: Medicines  Take, use, or apply over-the-counter and prescription medicines only as told by your doctor. These may include nasal sprays.  If you were prescribed an antibiotic medicine, take it as told by your doctor. Do not stop taking the antibiotic even if you start to feel better. Hydrate and Humidify  Drink enough water to keep your pee (urine) clear or pale yellow.  Use a cool mist humidifier to keep the humidity level in your home above 50%.  Breathe in steam for 10-15 minutes, 3-4 times a day or as told by your doctor. You can do this in the bathroom while a hot shower is running.  Try not to spend time in cool or dry air. Rest  Rest as much as possible.  Sleep with your head raised (elevated).  Make sure to get enough sleep each night. General instructions  Put a warm, moist washcloth on your face 3-4 times a day or as told by your doctor. This will help with discomfort.  Wash your hands often with soap and water. If there is no soap and water, use hand sanitizer.  Do  not smoke. Avoid being around people who are smoking (secondhand smoke).  Keep all follow-up visits as told by your doctor. This is important. Contact a doctor if:  You have a fever.  Your symptoms get worse.  Your symptoms do not get better within 10 days. Get help right away if:  You have a very bad headache.  You cannot stop throwing up (vomiting).  You have pain or swelling around your face or eyes.  You have trouble seeing.  You feel confused.  Your neck is stiff.  You have trouble breathing. This information is not intended to replace advice given to you by your health care provider. Make sure you discuss any questions you have with your health care provider. Document Released: 09/07/2007 Document Revised: 11/15/2015 Document Reviewed: 01/14/2015 Elsevier Interactive Patient Education  [employer].

## 2017-09-19 NOTE — Progress Notes (Signed)
Patient ID: Katelyn Baldwin, female   DOB: 1965/06/19, 52 y.o.   MRN: [id number]  PCP: McLean-Scocuzza, Nino Glow, MD  Subjective:  Katelyn Baldwin is a 52 y.o. year old very pleasant female patient who presents with symptoms including nasal congestion, sinus pressure, rhinitis,  cough that is not productive and does not wake her at night -started: 8 days ago , symptoms are not improving.  -previous treatments: Alka Seltzer OTC, NyQuil, and nasal saline spray have provided limited benefit  -sick contacts/travel/risks: denies flu exposure. No recent sick contact exposure. She does work in a day care setting.  -Hx of: seasonal allergies She is not a smoker No history of asthma or COPD No influenza vaccine this year  ROS-denies fever, SOB, NVD, tooth pain  Pertinent Past Medical History- HTN  Medications- reviewed  Current Outpatient Medications  Medication Sig Dispense Refill  . amLODipine (NORVASC) 2.5 MG tablet Take 1 tablet (2.5 mg total) by mouth daily. May take another dose if >140/>90 180 tablet 3  . atorvastatin (LIPITOR) 20 MG tablet Take 1 tablet (20 mg total) by mouth daily at 6 PM. 90 tablet 3  . hydrochlorothiazide (MICROZIDE) 12.5 MG capsule Take 1 capsule by mouth daily.  4  . hydrOXYzine (ATARAX/VISTARIL) 25 MG tablet Take 1 tablet (25 mg total) by mouth 3 (three) times daily as needed for itching. 30 tablet 0  . triamcinolone cream (KENALOG) 0.1 % Apply 1 application topically 2 (two) times daily. 30 g 0   No current facility-administered medications for this visit.     Objective: BP 134/86 (BP Location: Left Arm, Patient Position: Sitting, Cuff Size: Normal)   Pulse 75   Temp 98.8 F (37.1 C) (Oral)   Resp 16   Wt 176 lb 4 oz (79.9 kg)   LMP 02/21/2017   SpO2 98%   BMI 35.60 kg/m  Gen: NAD, resting comfortably HEENT: Turbinates erythematous, TMs normal bilaterally, mild cerumen noted bilaterally, pharynx mildly erythematous with exudate or edema, +maxillary and frontal  sinus tenderness CV: RRR no murmurs rubs or gallops Lungs: CTAB no crackles, wheeze, rhonchi Abdomen: soft/nontender/nondistended/normal bowel sounds. No rebound or guarding.  Ext: no edema Skin: warm, dry, no rash Neuro: grossly normal, moves all extremities  Assessment/Plan: 1. Acute pansinusitis, recurrence not specified Duration of symptoms; no improvement; risk factors in a day care setting. We discussed options for treatment and she will continue supportive measures and Augmentin was provided if no improvement. Advised patient on supportive measures:  Get rest, drink plenty of fluids, and use tylenol as needed and Mucinex DM for cough. Follow up if fever >101, if symptoms worsen or if symptoms are not improving. Patient verbalizes understanding.   - amoxicillin-clavulanate (AUGMENTIN) 875-125 MG tablet; Take 1 tablet by mouth 2 (two) times daily.  Dispense: 20 tablet; Refill: 0  Finally, we reviewed reasons to return to care including if symptoms worsen or persist or new concerns arise- once again particularly shortness of breath or fever.   Laurita Quint, FNP

## 2017-09-20 ENCOUNTER — Encounter: Payer: Self-pay | Admitting: Gastroenterology

## 2017-09-28 ENCOUNTER — Ambulatory Visit: Payer: Self-pay | Admitting: *Deleted

## 2017-09-28 ENCOUNTER — Ambulatory Visit (INDEPENDENT_AMBULATORY_CARE_PROVIDER_SITE_OTHER): Payer: 59 | Admitting: Family Medicine

## 2017-09-28 ENCOUNTER — Ambulatory Visit: Payer: 59 | Admitting: Internal Medicine

## 2017-09-28 ENCOUNTER — Encounter: Payer: Self-pay | Admitting: Family Medicine

## 2017-09-28 VITALS — BP 140/90 | HR 87 | Temp 100.5°F | Wt 176.4 lb

## 2017-09-28 DIAGNOSIS — J029 Acute pharyngitis, unspecified: Secondary | ICD-10-CM | POA: Diagnosis not present

## 2017-09-28 DIAGNOSIS — J039 Acute tonsillitis, unspecified: Secondary | ICD-10-CM

## 2017-09-28 LAB — POCT RAPID STREP A (OFFICE): Rapid Strep A Screen: NEGATIVE

## 2017-09-28 MED ORDER — PREDNISONE 10 MG PO TABS: ORAL_TABLET | ORAL | 0 refills | Status: DC

## 2017-09-28 NOTE — Progress Notes (Signed)
Subjective:    Patient ID: Katelyn Baldwin, female    DOB: 07/28/1965, 52 y.o.   MRN: [id number]  HPI  Katelyn Baldwin is a 52 year old female who presents today with sore throat, fever with Tmax of 100.6, and chills. Associated swollen lymph node on her right side is also present. Associated body aches, decreased appetite,  and ear pressure are present.  She was evaluated on 09/19/17 and symptoms were consistent with sinusitis. She was provided Augmentin and reports that she still has approximately 10 left. She reports she has missed doses and has not taken this BID as prescribed.  Treatment with Alka Seltzer cold plus and Nyquil have provided limited benefit.  She did not have a flu vaccine this year. 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 She is not a smoker. No history of asthma or COPD.   She denies SOB, N/V/D, cough, rash, difficulty breathing, neck pain, stiff neck,  tooth pain  Review of Systems  Constitutional: Positive for chills and fever.  HENT: Positive for sore throat. Negative for postnasal drip, sinus pressure and sinus pain.        Ear pressure  Respiratory: Negative for cough, shortness of breath and wheezing.   Cardiovascular: Negative for chest pain and palpitations.  Gastrointestinal: Negative for abdominal pain, diarrhea, nausea and vomiting.  Genitourinary: Negative for dysuria.  Musculoskeletal: Positive for myalgias.  Skin: Negative for rash.  Neurological: Negative for dizziness, weakness, light-headedness and headaches.   Past Medical History:  Diagnosis Date  . Depression   . Fibroid, uterine    [place] 01/03/11   . Headache   . Hypertension      Social History   Socioeconomic History  . Marital status: Widowed    Spouse name: Not on file  . Number of children: Not on file  . Years of education: Not on file  . Highest education level: Not on file  Occupational History  . Not on file  Social Needs  . Financial resource strain:  Not on file  . Food insecurity:    Worry: Not on file    Inability: Not on file  . Transportation needs:    Medical: Not on file    Non-medical: Not on file  Tobacco Use  . Smoking status: Never Smoker  . Smokeless tobacco: Never Used  Substance and Sexual Activity  . Alcohol use: No    Frequency: Never  . Drug use: No  . Sexual activity: Not Currently  Lifestyle  . Physical activity:    Days per week: Not on file    Minutes per session: Not on file  . Stress: Not on file  Relationships  . Social connections:    Talks on phone: Not on file    Gets together: Not on file    Attends religious service: Not on file    Active member of club or organization: Not on file    Attends meetings of clubs or organizations: Not on file    Relationship status: Not on file  . Intimate partner violence:    Fear of current or ex partner: Not on file    Emotionally abused: Not on file    Physically abused: Not on file    Forced sexual activity: Not on file  Other Topics Concern  . Not on file  Social History Narrative   Widow husband died age 91 pancreatitic cancer    3 kids ages 37, 43, 53 as of 08/2017  Optometrist    Lives in [place] and [place]    2 year degree    Divorced now single    Never smoker    No guns, wears seat belt     Past Surgical History:  Procedure Laterality Date  . COLONOSCOPY WITH PROPOFOL N/A 09/15/2017   Procedure: COLONOSCOPY WITH PROPOFOL;  Surgeon: Virgel Manifold, MD;  Location: [facility] ENDOSCOPY;  Service: Gastroenterology;  Laterality: N/A;  . DILATION AND CURETTAGE OF UTERUS     tubal pregnancy     Family History  Problem Relation Age of Onset  . Hyperlipidemia Mother   . Cancer Father        lung smoker   . Alcohol abuse Father   . Arthritis Sister   . Depression Sister   . Alcohol abuse Daughter   . Cancer Maternal Aunt        uterine  . Cancer Maternal Grandmother        ovarian  . Cancer Maternal Grandfather         colon or prostate cancer   . Alcohol abuse Paternal Grandfather   . Breast cancer Neg Hx     Allergies  Allergen Reactions  . Shrimp [Shellfish Allergy]     Current Outpatient Medications on File Prior to Visit  Medication Sig Dispense Refill  . amLODipine (NORVASC) 2.5 MG tablet Take 1 tablet (2.5 mg total) by mouth daily. May take another dose if >140/>90 180 tablet 3  . amoxicillin-clavulanate (AUGMENTIN) 875-125 MG tablet Take 1 tablet by mouth 2 (two) times daily. 20 tablet 0  . atorvastatin (LIPITOR) 20 MG tablet Take 1 tablet (20 mg total) by mouth daily at 6 PM. 90 tablet 3  . hydrochlorothiazide (MICROZIDE) 12.5 MG capsule Take 1 capsule by mouth daily.  4  . hydrOXYzine (ATARAX/VISTARIL) 25 MG tablet Take 1 tablet (25 mg total) by mouth 3 (three) times daily as needed for itching. 30 tablet 0  . triamcinolone cream (KENALOG) 0.1 % Apply 1 application topically 2 (two) times daily. 30 g 0   No current facility-administered medications on file prior to visit.     BP 140/90 (BP Location: Left Arm, Patient Position: Sitting, Cuff Size: Normal)   Pulse 87   Temp (!) 100.5 F (38.1 C) (Oral)   Wt 176 lb 6 oz (80 kg)   LMP 02/21/2017   SpO2 98%   BMI 35.62 kg/m       Objective:   Physical Exam  Constitutional: She is oriented to person, place, and time. She appears well-developed and well-nourished.  HENT:  Right Ear: Hearing and tympanic membrane normal.  Left Ear: Hearing and tympanic membrane normal.  Mouth/Throat: Uvula is midline. Tonsillar exudate.  Bilateral tonsillar hypertrophy with exudate.  Eyes: Pupils are equal, round, and reactive to light.  Neck: Neck supple.  Cardiovascular: Normal rate and regular rhythm.  Pulmonary/Chest: Effort normal and breath sounds normal. No respiratory distress.  Neurological: She is alert and oriented to person, place, and time.  Skin: Skin is warm and dry. No erythema.  Psychiatric: She has a normal mood and affect. Her  behavior is normal. Judgment and thought content normal.      Assessment & Plan:  1. Acute tonsillitis, unspecified etiology Symptoms are consistent with acute tonsillitis. She has Augmentin and we discussed that this should be taken twice daily and full course completed. She reports having "at least 10 left" so she will continue Augmentin twice daily for symptoms. Prednisone  provided and also a referral to ENT. She was referred approximately 3 years ago and she states that it was recommended to have tonsils removed at that time. With recurring sore throat symptoms, she is interested in referral today. No evidence of peritonsillar abscess or retropharyngeal abscess present. No airway impairment or evidence of epiglottitis present. Advised patient on supportive measures:  Get rest, drink plenty of fluids, and use tylenol as needed for pain. Follow up if fever >101, if symptoms worsen or if symptoms are not improved in 3 days. Patient verbalizes understanding.    - predniSONE (DELTASONE) 10 MG tablet; Take 4 tablets daily for 4 days, 3 tabs daily for 2 days, 2 tabs daily for 2 days, and one tab daily for 2 days.  Dispense: 28 tablet; Refill: 0 - Ambulatory referral to ENT  2. Pharyngitis, unspecified etiology Rapid Strep is negative.  - POCT rapid strep A  Delano Metz, FNP-C

## 2017-09-28 NOTE — Patient Instructions (Signed)
Please take Augmentin twice daily for symptoms.  A steroid has been provided to decrease swelling.  You can use ibuprofen or acetaminophen for discomfort.  You will be contact about your referral.  Please let us know if you have not heard back within 1 week about your referral.    Tonsillitis Tonsillitis is an infection of the throat. This infection causes the tonsils to become red, tender, and swollen. Tonsils are tissues in the back of your throat. If bacteria caused your infection, antibiotic medicine will be given to you. Sometimes, symptoms of this infection can be helped with the use of steroid medicine. If your tonsillitis is very bad (severe) and happens often, you may need to get your tonsils removed (tonsillectomy). Follow these instructions at home: Medicines  Take over-the-counter and prescription medicines only as told by your doctor.  If you were prescribed an antibiotic, take it as told by your doctor. Do not stop taking the antibiotic even if you start to feel better. Eating and drinking  Drink enough fluid to keep your pee (urine) clear or pale yellow.  While your throat is sore, eat soft or liquid foods like: ? Soup. ? Sherbert. ? Instant breakfast drinks.  Drink warm fluids.  Eat frozen ice pops. General instructions  Rest as much as possible and get plenty of sleep.  Gargle with a salt-water mixture 3-4 times a day or as needed. To make a salt-water mixture, completely dissolve -1 tsp of salt in 1 cup of warm water.  Wash your hands often with soap and water. If there is no soap and water, use hand sanitizer.  Do not share cups, bottles, or other utensils until your symptoms are gone.  Do not smoke. If you need help quitting, ask your doctor.  Keep all follow-up visits as told by your doctor. This is important. Contact a doctor if:  You have large, tender lumps in your neck.  You have a fever that does not go away after 2-3 days.  You have a  rash.  You cough up green, yellow-brown, or bloody fluid.  You cannot swallow liquids or food for 24 hours.  Only one of your tonsils is swollen. Get help right away if:  You have any new symptoms such as: ? Vomiting ? Very bad headache ? Stiff neck ? Chest pain ? Trouble breathing or swallowing  You have very bad throat pain and also have drooling or voice changes.  You have very bad pain that is not helped by medicine.  You cannot fully open your mouth.  You have redness, swelling, or severe pain anywhere in your neck. Summary  Tonsillitis causes your tonsils to be red, tender, and swollen.  While your throat is sore eat soft or liquid foods.  Gargle with a salt-water mixture 3-4 times a day or as needed.  Do not share cups, bottles, or other utensils until your symptoms are gone. This information is not intended to replace advice given to you by your health care provider. Make sure you discuss any questions you have with your health care provider. Document Released: 09/07/2007 Document Revised: 08/27/2015 Document Reviewed: 09/07/2012 Elsevier Interactive Patient Education  2017 [employer].

## 2017-09-28 NOTE — Telephone Encounter (Signed)
Pt called with having a swollen lymph node on the right side of her neck. 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 She did not have a fever in the office but now has one of 100.6. She also has ear pain (both ears) and sore throat. Feels achy.  Appointment made per protocol for today. Will route to [facility] at [facility]. Advised to call back for increase in symptoms. Home care advise given to patient with verbal understanding.  Reason for Disposition . [1] Single large node AND [2] size > 1 inch (2.5 cm) AND [3] fever  Answer Assessment - Initial Assessment Questions 1. LOCATION: "Where is the swollen node located?" "Is the matching node on the other side of the body also swollen?"      Neck, right side 2. SIZE: "How big is the node?" (Inches or centimeters) (or compare to common objects such as pea, bean, marble, golf ball)      Quarter size 3. ONSET: "When did the swelling start?"      Last night 4. NECK NODES: "Is there a sore throat, runny nose or other symptoms of a cold?"      Sore throat 5. GROIN OR ARMPIT NODES: "Is there a sore, scratch, cut or painful red area on that arm or leg?"      no 6. FEVER: "Do you have a fever?" If so, ask: "What is it, how was it measured, and when did it start?"      100.6 yesterday 7. CAUSE: "What do you think is causing the swollen lymph nodes?"     Possibly flu 8. OTHER SYMPTOMS: "Do you have any other symptoms?"     no 9. PREGNANCY: "Is there any chance you are pregnant?" "When was your last menstrual period? No,  LMP 3 months ago  Protocols used: [place]

## 2017-09-28 NOTE — Telephone Encounter (Signed)
Patient triaged by [facility] nurse and has scheduled appt with Delano Metz today.

## 2017-11-06 ENCOUNTER — Encounter: Payer: Self-pay | Admitting: Gastroenterology

## 2017-11-20 ENCOUNTER — Encounter: Payer: Self-pay | Admitting: Internal Medicine

## 2017-11-21 NOTE — Telephone Encounter (Signed)
Please advise 

## 2017-11-23 ENCOUNTER — Telehealth: Payer: Self-pay | Admitting: Internal Medicine

## 2017-11-23 NOTE — Telephone Encounter (Signed)
Copied from [place] [id number]. Topic: Appointment Scheduling - Scheduling Inquiry for Clinic >> Nov 23, 2017  8:24 AM Synthia Innocent wrote: Reason for CRM: Requesting TB test. Also is having surgery to have tonsils removed. Throat is sore as well as ears now, should she call ENT regarding what to take prior to surgery or can Dr Terese Door assist? Please advise

## 2017-11-23 NOTE — Telephone Encounter (Signed)
Left message advising patient that PCP is out of office and she should call ENT

## 2017-11-24 ENCOUNTER — Encounter
Admission: RE | Admit: 2017-11-24 | Discharge: 2017-11-24 | Disposition: A | Discharge status: Home / Self Care | Payer: 59 | Source: Ambulatory Visit | Attending: Otolaryngology | Admitting: Otolaryngology

## 2017-11-24 ENCOUNTER — Other Ambulatory Visit: Payer: Self-pay

## 2017-11-24 ENCOUNTER — Encounter: Payer: Self-pay | Admitting: *Deleted

## 2017-11-24 NOTE — Telephone Encounter (Signed)
FYI

## 2017-11-24 NOTE — Telephone Encounter (Signed)
Pt is aware she will call ENT again.

## 2017-11-24 NOTE — Patient Instructions (Signed)
Your procedure is scheduled on: 11-30-17 THURSDAY Report to Same Day Surgery 2nd floor medical mall [facility] Entrance-take elevator on left to 2nd floor.  Check in with surgery information desk.) To find out your arrival time please call [phone] between 1PM - 3PM on 11-29-17 [facility]  Remember: Instructions that are not followed completely may result in serious medical risk, up to and including death, or upon the discretion of your surgeon and anesthesiologist your surgery may need to be rescheduled.    _x___ 1. Do not eat food after midnight the night before your procedure. NO GUM OR CANDY AFTER MIDNIGHT.  You may drink clear liquids up to 2 hours before you are scheduled to arrive at the hospital for your procedure.  Do not drink clear liquids within 2 hours of your scheduled arrival to the hospital.  Clear liquids include  --Water or Apple juice without pulp  --Clear carbohydrate beverage such as ClearFast or Gatorade  --Black Coffee or Clear Tea (No milk, no creamers, do not add anything to the coffee or Tea   ____Ensure clear carbohydrate drink on the way to the hospital for bariatric patients  ____Ensure clear carbohydrate drink 3 hours before surgery for Dr Dwyane Luo patients if physician instructed.    __x__ 2. No Alcohol for 24 hours before or after surgery.   __x__3. No Smoking or e-cigarettes for 24 prior to surgery.  Do not use any chewable tobacco products for at least 6 hour prior to surgery   ____  4. Bring all medications with you on the day of surgery if instructed.    __x__ 5. Notify your doctor if there is any change in your medical condition     (cold, fever, infections).    x___6. On the morning of surgery brush your teeth with toothpaste and water.  You may rinse your mouth with mouth wash if you wish.  Do not swallow any toothpaste or mouthwash.   Do not wear jewelry, make-up, hairpins, clips or nail polish.  Do not wear lotions, powders, or perfumes.  You may wear deodorant.  Do not shave 48 hours prior to surgery. Men may shave face and neck.  Do not bring valuables to the hospital.    [facility] is not responsible for any belongings or valuables.               Contacts, dentures or bridgework may not be worn into surgery.  Leave your suitcase in the car. After surgery it may be brought to your room.  For patients admitted to the hospital, discharge time is determined by your treatment team.  _  Patients discharged the day of surgery will not be allowed to drive home.  You will need someone to drive you home and stay with you the night of your procedure.    Please read over the following fact sheets that you were given:   [facility] Preparing for Surgery   _x___ TAKE THE FOLLOWING MEDICATION THE MORNING OF SURGERY WITH A SMALL SIP OF WATER. These include:  1. AMLODIPINE (NORVASC)  2. PREDNISONE  3.  4.  5.  6.  ____Fleets enema or Magnesium Citrate as directed.   ____ Use CHG Soap or sage wipes as directed on instruction sheet   ____ Use inhalers on the day of surgery and bring to hospital day of surgery  ____ Stop Metformin and Janumet 2 days prior to surgery.    ____ Take 1/2 of usual insulin dose the night  before surgery and none on the morning surgery.   ____ Follow recommendations from Cardiologist, Pulmonologist or PCP regarding stopping Aspirin, Coumadin, Plavix ,Eliquis, Effient, or Pradaxa, and Pletal.  X____Stop Anti-inflammatories such as Advil, Aleve, Ibuprofen, Motrin, Naproxen, Naprosyn, Goodies powders or aspirin products NOW-OK to take Tylenol    ____ Stop supplements until after surgery.     ____ Bring C-Pap to the hospital.

## 2017-11-25 ENCOUNTER — Encounter: Payer: Self-pay | Admitting: Internal Medicine

## 2017-11-27 ENCOUNTER — Encounter
Admission: RE | Admit: 2017-11-27 | Discharge: 2017-11-27 | Disposition: A | Discharge status: Home / Self Care | Payer: No Typology Code available for payment source | Source: Ambulatory Visit | Attending: Otolaryngology | Admitting: Otolaryngology

## 2017-11-27 DIAGNOSIS — I10 Essential (primary) hypertension: Secondary | ICD-10-CM | POA: Diagnosis not present

## 2017-11-27 DIAGNOSIS — J351 Hypertrophy of tonsils: Secondary | ICD-10-CM | POA: Diagnosis not present

## 2017-11-27 DIAGNOSIS — Z01812 Encounter for preprocedural laboratory examination: Secondary | ICD-10-CM

## 2017-11-27 DIAGNOSIS — Z79899 Other long term (current) drug therapy: Secondary | ICD-10-CM | POA: Diagnosis not present

## 2017-11-27 DIAGNOSIS — E785 Hyperlipidemia, unspecified: Secondary | ICD-10-CM | POA: Diagnosis not present

## 2017-11-27 DIAGNOSIS — Z91013 Allergy to seafood: Secondary | ICD-10-CM | POA: Diagnosis not present

## 2017-11-27 LAB — POTASSIUM: POTASSIUM: 3.3 mmol/L — AB (ref 3.5–5.1)

## 2017-11-28 ENCOUNTER — Encounter: Payer: Self-pay | Admitting: Internal Medicine

## 2017-11-28 ENCOUNTER — Ambulatory Visit: Payer: 59 | Admitting: Internal Medicine

## 2017-11-28 VITALS — BP 146/90 | HR 62 | Temp 98.1°F | Resp 12 | Ht 59.0 in | Wt 181.1 lb

## 2017-11-28 DIAGNOSIS — E876 Hypokalemia: Secondary | ICD-10-CM

## 2017-11-28 DIAGNOSIS — Z111 Encounter for screening for respiratory tuberculosis: Secondary | ICD-10-CM | POA: Diagnosis not present

## 2017-11-28 DIAGNOSIS — E785 Hyperlipidemia, unspecified: Secondary | ICD-10-CM | POA: Insufficient documentation

## 2017-11-28 DIAGNOSIS — I10 Essential (primary) hypertension: Secondary | ICD-10-CM

## 2017-11-28 DIAGNOSIS — Z780 Asymptomatic menopausal state: Secondary | ICD-10-CM | POA: Diagnosis not present

## 2017-11-28 NOTE — Progress Notes (Signed)
Chief Complaint  Patient presents with  . Follow-up   F/u  1. 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 HTN sl elevated no meds today and on steroids. HLD on statin tolerating  3. Tonsillectomy with Dr. Pryor Ochoa sch 11/30/17 K was 3.3 disc high K foods  4. Reviewed labs Ulysses indicates menopause no cycle x 6 months then bleeding x  1day then spotting    Review of Systems  Constitutional: Negative for weight loss.  HENT: Negative for hearing loss.        +ear wax  Eyes: Negative for blurred vision.  Respiratory: Negative for shortness of breath.   Cardiovascular: Negative for chest pain.  Skin: Negative for rash.  Neurological: Negative for headaches.  Psychiatric/Behavioral: Negative for depression. The patient is not nervous/anxious.        Mood anxiety better since changing jobs did not see therapist    Past Medical History:  Diagnosis Date  . Depression   . Fibroid, uterine    [place] 01/03/11   . Headache   . Hypertension    Past Surgical History:  Procedure Laterality Date  . COLONOSCOPY WITH PROPOFOL N/A 09/15/2017   Procedure: COLONOSCOPY WITH PROPOFOL;  Surgeon: Virgel Manifold, MD;  Location: [facility] ENDOSCOPY;  Service: Gastroenterology;  Laterality: N/A;  . DILATION AND CURETTAGE OF UTERUS     tubal pregnancy    Family History  Problem Relation Age of Onset  . Hyperlipidemia Mother   . Cancer Father        lung smoker   . Alcohol abuse Father   . Arthritis Sister   . Depression Sister   . Alcohol abuse Daughter   . Cancer Maternal Aunt        uterine  . Cancer Maternal Grandmother        ovarian  . Cancer Maternal Grandfather        colon or prostate cancer   . Alcohol abuse Paternal Grandfather   . Breast cancer Neg Hx    Social History   Socioeconomic History  . Marital status: Widowed    Spouse name: Not on file  . Number of children: Not on file  . Years of education: Not on file  . Highest education level:  Not on file  Occupational History  . Not on file  Social Needs  . Financial resource strain: Not on file  . Food insecurity:    Worry: Not on file    Inability: Not on file  . Transportation needs:    Medical: Not on file    Non-medical: Not on file  Tobacco Use  . Smoking status: Never Smoker  . Smokeless tobacco: Never Used  Substance and Sexual Activity  . Alcohol use: No    Frequency: Never  . Drug use: No  . Sexual activity: Not Currently  Lifestyle  . Physical activity:    Days per week: Not on file    Minutes per session: Not on file  . Stress: Not on file  Relationships  . Social connections:    Talks on phone: Not on file    Gets together: Not on file    Attends religious service: Not on file    Active member of club or organization: Not on file    Attends meetings of clubs or organizations: Not on file    Relationship status: Not on file  . Intimate partner violence:    Fear of current or ex  partner: Not on file    Emotionally abused: Not on file    Physically abused: Not on file    Forced sexual activity: Not on file  Other Topics Concern  . Not on file  Social History Narrative   Widow husband died age 38 pancreatitic cancer    3 kids ages 58, 14, 19 as of 08/2017   Infant classroom teacher    Lives in [place] and [place]    2 year degree    Divorced now single    Never smoker    No guns, wears seat belt    Current Meds  Medication Sig  . amLODipine (NORVASC) 2.5 MG tablet Take 1 tablet (2.5 mg total) by mouth daily. May take another dose if >140/>90 (Patient taking differently: Take 2.5 mg by mouth every morning. May take another dose if >140/>90)  . amoxicillin-clavulanate (AUGMENTIN) 875-125 MG tablet Take 1 tablet by mouth 2 (two) times daily.  Marland Kitchen atorvastatin (LIPITOR) 20 MG tablet Take 1 tablet (20 mg total) by mouth daily at 6 PM.  . hydrochlorothiazide (MICROZIDE) 12.5 MG capsule Take 1 capsule by mouth every morning.   . predniSONE  (DELTASONE) 10 MG tablet Take 10 mg by mouth daily with breakfast. 6 DAY TAPER   Allergies  Allergen Reactions  . Shrimp [Shellfish Allergy] Hives and Swelling    Throat closes    Recent Results (from the past 2160 hour(s))  Hepatic function panel     Status: Abnormal   Collection Time: 09/08/17  8:00 AM  Result Value Ref Range   Total Protein 7.9 6.1 - 8.1 g/dL   Albumin 4.1 3.6 - 5.1 g/dL   Globulin 3.8 (H) 1.9 - 3.7 g/dL (calc)   AG Ratio 1.1 1.0 - 2.5 (calc)   Total Bilirubin 0.3 0.2 - 1.2 mg/dL   Bilirubin, Direct 0.1 0.0 - 0.2 mg/dL   Indirect Bilirubin 0.2 0.2 - 1.2 mg/dL (calc)   Alkaline phosphatase (APISO) 49 33 - 130 U/L   AST 16 10 - 35 U/L   ALT 13 6 - 29 U/L  Lipid panel     Status: Abnormal   Collection Time: 09/08/17  8:00 AM  Result Value Ref Range   Cholesterol 251 (H) <200 mg/dL   HDL 65 >50 mg/dL   Triglycerides 83 <150 mg/dL   LDL Cholesterol (Calc) 167 (H) mg/dL (calc)    Comment: Reference range: <100 . Desirable range <100 mg/dL for primary prevention;   <70 mg/dL for patients with CHD or diabetic patients  with > or = 2 CHD risk factors. Marland Kitchen LDL-C is now calculated using the Martin-Hopkins  calculation, which is a validated novel method providing  better accuracy than the Friedewald equation in the  estimation of LDL-C.  Cresenciano Genre et al. Annamaria Helling. 3875;643(32): 2061-2068  (http://education.[facility].com/faq/FAQ164)    Total CHOL/HDL Ratio 3.9 <5.0 (calc)   Non-HDL Cholesterol (Calc) 186 (H) <130 mg/dL (calc)    Comment: For patients with diabetes plus 1 major ASCVD risk  factor, treating to a non-HDL-C goal of <100 mg/dL  (LDL-C of <70 mg/dL) is considered a therapeutic  option.   TSH     Status: None   Collection Time: 09/08/17  8:00 AM  Result Value Ref Range   TSH 1.70 mIU/L    Comment:           Reference Range .           > or = 20 Years  0.40-4.50 .  Pregnancy Ranges           First trimester    0.26-2.66            Second trimester   0.55-2.73           Third trimester    0.43-2.91   T4, free     Status: None   Collection Time: 09/08/17  8:00 AM  Result Value Ref Range   Free T4 1.0 0.8 - 1.8 ng/dL  Vitamin D (25 hydroxy)     Status: Abnormal   Collection Time: 09/08/17  8:00 AM  Result Value Ref Range   Vit D, 25-Hydroxy 26 (L) 30 - 100 ng/mL    Comment: Vitamin D Status         25-OH Vitamin D: . Deficiency:                    <20 ng/mL Insufficiency:             20 - 29 ng/mL Optimal:                 > or = 30 ng/mL . For 25-OH Vitamin D testing on patients on  D2-supplementation and patients for whom quantitation  of D2 and D3 fractions is required, the QuestAssureD(TM) 25-OH VIT D, (D2,D3), LC/MS/MS is recommended: order  code 713 [id number] 5938 (patients >68yr). . For more information on this test, go to: http://education.questdiagnostics.com/faq/FAQ163 (This link is being provided for  informational/educational purposes only.)   [place]    Status: None   Collection Time: 09/08/17  8:00 AM  Result Value Ref Range   FSH 88.4 mIU/mL    Comment:                     Reference Range .              Follicular Phase       27.7-82.4             Mid-cycle Peak         3.1-17.7              Luteal Phase           1.5- 9.1              Postmenopausal       23.0-116.3              .   Hepatitis B surface antibody     Status: Abnormal   Collection Time: 09/08/17  8:00 AM  Result Value Ref Range   Hepatitis B-Post 8 (L) > OR = 10 mIU/mL    Comment: . Patient does not have immunity to hepatitis B virus. . For additional information, please refer to http://education.questdiagn[facility].com/faq/FAQ105 (This link is being provided for informational/ educational purposes only).   Measles/Mumps/Rubella Immunity     Status: Abnormal   Collection Time: 09/08/17  8:00 AM  Result Value Ref Range   Rubeola IgG <25.00 (L) AU/mL    Comment: AU/mL            Interpretation -----             -------------- <25.00           Negative 25.00-29.99      Equivocal >29.99           Positive . A positive result indicates that the patient has antibody to measles virus. It does not differentiate  between an active or  past infection. The clinical  diagnosis must be interpreted in conjunction with  clinical signs and symptoms of the patient.    Mumps IgG >300.00 AU/mL    Comment:  AU/mL           Interpretation -------         ---------------- <9.00             Negative 9.00-10.99        Equivocal >10.99            Positive A positive result indicates that the patient has  antibody to mumps virus. It does not differentiate between an  active or past infection. The clinical diagnosis must be interpreted in conjunction with clinical signs and symptoms of the patient. .    Rubella 6.16 index    Comment:     Index            Interpretation     -----            --------------       <0.90            Not consistent with Immunity     0.90-0.99        Equivocal     > or = 1.00      Consistent with Immunity  . The presence of rubella IgG antibody suggests  immunization or past or current infection with rubella virus.   HM COLONOSCOPY     Status: None   Collection Time: 09/15/17 12:00 AM  Result Value Ref Range   HM Colonoscopy See Report (in chart) See Report (in chart), Patient Reported  Pregnancy, urine POC     Status: None   Collection Time: 09/15/17  9:47 AM  Result Value Ref Range   Preg Test, Ur NEGATIVE NEGATIVE    Comment:        THE SENSITIVITY OF THIS METHODOLOGY IS >24 mIU/mL   Surgical pathology     Status: None   Collection Time: 09/15/17 11:35 AM  Result Value Ref Range   SURGICAL PATHOLOGY      Surgical Pathology CASE: [id number] PATIENT: Lyman Speller Surgical Pathology Report     SPECIMEN SUBMITTED: A. Colon polyp, sigmoid; cbx  CLINICAL HISTORY: None provided  PRE-OPERATIVE DIAGNOSIS: Screening Z12.11  POST-OPERATIVE  DIAGNOSIS: Polyp     DIAGNOSIS: A. COLON POLYP, SIGMOID; COLD BIOPSY: - HYPERPLASTIC POLYP. - NEGATIVE FOR DYSPLASIA AND MALIGNANCY.  GROSS DESCRIPTION: A. Labeled: Cbx sigmoid colon polyp Received: In formalin Tissue fragment(s): 2 Size: 0.3 and 0.5 cm Description: Tan fragments Entirely submitted in one cassette.     Final Diagnosis performed by Quay Burow, MD.   Electronically signed 09/18/2017 9:40:28AM The electronic signature indicates that the named Attending Pathologist has evaluated the specimen  Technical component performed at [facility], [address], [place], [place] [address] Lab: [phone] Dir: Rush Farmer, MD, MMM  Professional component performed at [facility], [facility], [place], [place], [place] [address] Lab: [phone] Dir: Dellia Nims. Rubinas, MD   POCT rapid strep A     Status: Normal   Collection Time: 09/28/17  2:14 PM  Result Value Ref Range   Rapid Strep A Screen Negative Negative  Potassium     Status: Abnormal   Collection Time: 11/27/17 11:53 AM  Result Value Ref Range   Potassium 3.3 (L) 3.5 - 5.1 mmol/L    Comment: Performed at [facility], [address], [place], [place] [address]   Objective  Body mass index is 36.58 kg/m. Wt Readings from Last 3 Encounters:  11/28/17 181 lb 2 oz (82.2 kg)  09/28/17 176 lb 6 oz (80 kg)  09/19/17 176 lb 4 oz (79.9 kg)   Temp Readings from Last 3 Encounters:  11/28/17 98.1 F (36.7 C) (Oral)  09/28/17 (!) 100.5 F (38.1 C) (Oral)  09/19/17 98.8 F (37.1 C) (Oral)   BP Readings from Last 3 Encounters:  11/28/17 (!) 146/90  09/28/17 140/90  09/19/17 134/86   Pulse Readings from Last 3 Encounters:  11/28/17 62  09/28/17 87  09/19/17 75    Physical Exam  Constitutional: She is oriented to person, place, and time. She appears well-developed and well-nourished. She is cooperative.  HENT:  Head: Normocephalic and atraumatic.  Mouth/Throat:  Oropharynx is clear and moist and mucous membranes are normal.  Mild cerumen b/l ears   Eyes: Pupils are equal, round, and reactive to light. Conjunctivae are normal.  Cardiovascular: Normal rate, regular rhythm and normal heart sounds.  Pulmonary/Chest: Effort normal and breath sounds normal.  Neurological: She is alert and oriented to person, place, and time. Gait normal.  Skin: Skin is warm, dry and intact.  Psychiatric: She has a normal mood and affect. Her speech is normal and behavior is normal. Judgment and thought content normal. Cognition and memory are normal.  Nursing note and vitals reviewed.   Assessment   1. HTN  2. Menopause  3. Pending tonsillectomy  4. HM Plan   1. Cont meds given High [place] food list  Check lipid in fuutre  2. Monitor bleeding  3. sch 11/30/17 Dr. Pryor Ochoa  4.  Declines flu shot  Tdap had 08/15/17  rec hep B and MMR vaccines  Declines STD check and will do pap in fuutre  sch mammo11/21/19 [place]  Check quantiferon gold today  Provider: Dr. Olivia Mackie McLean-Scocuzza-Internal Medicine

## 2017-11-28 NOTE — Patient Instructions (Addendum)
With blood work sch fasting cholesterol check in the next 3 months and pap smear   Take care  Results for Katelyn Baldwin, Katelyn Baldwin (MRN [id number]) as of 11/28/2017 08:19  Ref. Range 09/08/2017 08:00  Rubella Latest Units: index 6.16  Hepatitis B-Post Latest Ref Range: > OR = 10 mIU/mL 8 (L)  Mumps IgG Latest Units: AU/mL >300.00  Rubeola IgG Latest Units: AU/mL <25.00 (L)   Consider hepatitis B vaccine here  Consider Measles with health department   D3 vitamin D 2000-4000 IU daily   Results for Katelyn Baldwin, Katelyn Baldwin (MRN [id number]) as of 11/28/2017 08:19  Ref. Range 07/27/2017 15:02 09/08/2017 08:00  Sodium Latest Ref Range: 135 - 145 mmol/L 136   Potassium Latest Ref Range: 3.5 - 5.1 mmol/L 3.6   Chloride Latest Ref Range: 101 - 111 mmol/L 101   CO2 Latest Ref Range: 22 - 32 mmol/L 26   Glucose Latest Ref Range: 65 - 99 mg/dL 96   BUN Latest Ref Range: 6 - 20 mg/dL 13   Creatinine Latest Ref Range: 0.44 - 1.00 mg/dL 0.72   Calcium Latest Ref Range: 8.9 - 10.3 mg/dL 9.2   Anion gap Latest Ref Range: 5 - 15  9   AG Ratio Latest Ref Range: 1.0 - 2.5 (calc)  1.1  AST Latest Ref Range: 10 - 35 U/L  16  ALT Latest Ref Range: 6 - 29 U/L  13  Total Protein Latest Ref Range: 6.1 - 8.1 g/dL  7.9  Bilirubin, Direct Latest Ref Range: 0.0 - 0.2 mg/dL  0.1  Indirect Bilirubin Latest Ref Range: 0.2 - 1.2 mg/dL (calc)  0.2  Total Bilirubin Latest Ref Range: 0.2 - 1.2 mg/dL  0.3    Results for Katelyn Baldwin, Katelyn Baldwin (MRN [id number]) as of 11/28/2017 08:19  Ref. Range 09/08/2017 08:00  Cholesterol Latest Ref Range: <200 mg/dL 251 (H)  HDL Cholesterol Latest Ref Range: >50 mg/dL 65  LDL Cholesterol (Calc) Latest Units: mg/dL (calc) 167 (H)  Non-HDL Cholesterol (Calc) Latest Ref Range: <130 mg/dL (calc) 186 (H)  Triglycerides Latest Ref Range: <150 mg/dL 83   Results for Katelyn Baldwin, Katelyn Baldwin (MRN [id number]) as of 11/28/2017 08:19  Ref. Range 07/27/2017 15:02  WBC Latest Ref Range: 3.6 - 11.0 K/uL 4.6   RBC Latest Ref Range: 3.80 - 5.20 MIL/uL 4.56  Hemoglobin Latest Ref Range: 12.0 - 16.0 g/dL 12.5  HCT Latest Ref Range: 35.0 - 47.0 % 36.6  MCV Latest Ref Range: 80.0 - 100.0 fL 80.4  MCH Latest Ref Range: 26.0 - 34.0 pg 27.4  MCHC Latest Ref Range: 32.0 - 36.0 g/dL 34.1  RDW Latest Ref Range: 11.5 - 14.5 % 14.7 (H)  Platelets Latest Ref Range: 150 - 440 K/uL 270    Cholesterol Cholesterol is a white, waxy, fat-like substance that is needed by the human body in small amounts. The liver makes all the cholesterol we need. Cholesterol is carried from the liver by the blood through the blood vessels. Deposits of cholesterol (plaques) may build up on blood vessel (artery) walls. Plaques make the arteries narrower and stiffer. Cholesterol plaques increase the risk for heart attack and stroke. You cannot feel your cholesterol level even if it is very high. The only way to know that it is high is to have a blood test. Once you know your cholesterol levels, you should keep a record of the test results. Work with your health care provider to keep your levels in the desired range. What do  the results mean?  Total cholesterol is a rough measure of all the cholesterol in your blood.  LDL (low-density lipoprotein) is the "bad" cholesterol. This is the type that causes plaque to build up on the artery walls. You want this level to be low.  HDL (high-density lipoprotein) is the "good" cholesterol because it cleans the arteries and carries the LDL away. You want this level to be high.  Triglycerides are fat that the body can either burn for energy or store. High levels are closely linked to heart disease. What are the desired levels of cholesterol?  Total cholesterol below 200.  LDL below 100 for people who are at risk, below 70 for people at very high risk.  HDL above 40 is good. A level of 60 or higher is considered to be protective against heart disease.  Triglycerides below 150. How can I lower  my cholesterol? Diet Follow your diet program as told by your health care provider.  Choose fish or white meat chicken and [place], roasted or baked. Limit fatty cuts of red meat, fried foods, and processed meats, such as sausage and lunch meats.  Eat lots of fresh fruits and vegetables.  Choose whole grains, beans, pasta, potatoes, and cereals.  Choose olive oil, corn oil, or canola oil, and use only small amounts.  Avoid butter, mayonnaise, shortening, or palm kernel oils.  Avoid foods with trans fats.  Drink skim or nonfat milk and eat low-fat or nonfat yogurt and cheeses. Avoid whole milk, cream, ice cream, egg yolks, and full-fat cheeses.  Healthier desserts include angel food cake, ginger snaps, animal crackers, hard candy, popsicles, and low-fat or nonfat frozen yogurt. Avoid pastries, cakes, pies, and cookies.  Exercise  Follow your exercise program as told by your health care provider. A regular program: ? Helps to decrease LDL and raise HDL. ? Helps with weight control.  Do things that increase your activity level, such as gardening, walking, and taking the stairs.  Ask your health care provider about ways that you can be more active in your daily life.  Medicine  Take over-the-counter and prescription medicines only as told by your health care provider. ? Medicine may be prescribed by your health care provider to help lower cholesterol and decrease the risk for heart disease. This is usually done if diet and exercise have failed to bring down cholesterol levels. ? If you have several risk factors, you may need medicine even if your levels are normal.  This information is not intended to replace advice given to you by your health care provider. Make sure you discuss any questions you have with your health care provider. Document Released: 12/14/2000 Document Revised: 10/17/2015 Document Reviewed: 09/19/2015 Elsevier Interactive Patient Education  [employer].

## 2017-11-30 ENCOUNTER — Ambulatory Visit: Payer: No Typology Code available for payment source | Admitting: Anesthesiology

## 2017-11-30 ENCOUNTER — Encounter
Admission: RE | Disposition: A | Discharge status: Home / Self Care | Payer: Self-pay | Source: Ambulatory Visit | Attending: Otolaryngology

## 2017-11-30 ENCOUNTER — Encounter: Payer: Self-pay | Admitting: *Deleted

## 2017-11-30 ENCOUNTER — Other Ambulatory Visit: Payer: Self-pay

## 2017-11-30 ENCOUNTER — Ambulatory Visit
Admission: RE | Admit: 2017-11-30 | Discharge: 2017-11-30 | Disposition: A | Discharge status: Home / Self Care | Payer: No Typology Code available for payment source | Source: Ambulatory Visit | Attending: Otolaryngology | Admitting: Otolaryngology

## 2017-11-30 DIAGNOSIS — J351 Hypertrophy of tonsils: Secondary | ICD-10-CM | POA: Insufficient documentation

## 2017-11-30 DIAGNOSIS — Z91013 Allergy to seafood: Secondary | ICD-10-CM | POA: Insufficient documentation

## 2017-11-30 DIAGNOSIS — I10 Essential (primary) hypertension: Secondary | ICD-10-CM | POA: Insufficient documentation

## 2017-11-30 DIAGNOSIS — E785 Hyperlipidemia, unspecified: Secondary | ICD-10-CM | POA: Insufficient documentation

## 2017-11-30 DIAGNOSIS — Z79899 Other long term (current) drug therapy: Secondary | ICD-10-CM | POA: Insufficient documentation

## 2017-11-30 HISTORY — PX: TONSILLECTOMY: SHX5217

## 2017-11-30 LAB — POCT I-STAT 4, (NA,K, GLUC, HGB,HCT)
GLUCOSE: 86 mg/dL (ref 70–99)
HCT: 43 % (ref 36.0–46.0)
Hemoglobin: 14.6 g/dL (ref 12.0–15.0)
POTASSIUM: 4.6 mmol/L (ref 3.5–5.1)
SODIUM: 139 mmol/L (ref 135–145)

## 2017-11-30 LAB — POCT PREGNANCY, URINE: PREG TEST UR: NEGATIVE

## 2017-11-30 SURGERY — TONSILLECTOMY
Anesthesia: General

## 2017-11-30 MED ORDER — OXYCODONE HCL 5 MG/5ML PO SOLN
10.0000 mg | ORAL | Status: DC | PRN
  Administered 2017-11-30: 10 mg via ORAL
  Filled 2017-11-30: qty 10

## 2017-11-30 MED ORDER — MIDAZOLAM HCL 2 MG/2ML IJ SOLN
INTRAMUSCULAR | Status: DC | PRN
  Administered 2017-11-30: 2 mg via INTRAVENOUS

## 2017-11-30 MED ORDER — ONDANSETRON HCL 4 MG PO TABS: 4.0000 mg | ORAL_TABLET | Freq: Three times a day (TID) | ORAL | 0 refills | Status: AC | PRN

## 2017-11-30 MED ORDER — PROPOFOL 10 MG/ML IV BOLUS
INTRAVENOUS | Status: AC
  Filled 2017-11-30: qty 20

## 2017-11-30 MED ORDER — LACTATED RINGERS IV SOLN
INTRAVENOUS | Status: DC
  Administered 2017-11-30: 08:00:00 via INTRAVENOUS

## 2017-11-30 MED ORDER — OXYCODONE HCL 5 MG/5ML PO SOLN
ORAL | Status: AC
  Filled 2017-11-30: qty 5

## 2017-11-30 MED ORDER — FAMOTIDINE 20 MG PO TABS
20.0000 mg | ORAL_TABLET | Freq: Once | ORAL | Status: AC
  Administered 2017-11-30: 20 mg via ORAL

## 2017-11-30 MED ORDER — PROPOFOL 10 MG/ML IV BOLUS
INTRAVENOUS | Status: DC | PRN
  Administered 2017-11-30: 150 mg via INTRAVENOUS

## 2017-11-30 MED ORDER — FENTANYL CITRATE (PF) 100 MCG/2ML IJ SOLN
INTRAMUSCULAR | Status: DC | PRN
  Administered 2017-11-30 (×2): 50 ug via INTRAVENOUS

## 2017-11-30 MED ORDER — FENTANYL CITRATE (PF) 100 MCG/2ML IJ SOLN: 25.0000 ug | INTRAMUSCULAR | Status: DC | PRN

## 2017-11-30 MED ORDER — ACETAMINOPHEN 10 MG/ML IV SOLN
INTRAVENOUS | Status: AC
  Filled 2017-11-30: qty 100

## 2017-11-30 MED ORDER — BUPIVACAINE HCL 0.5 % IJ SOLN
INTRAMUSCULAR | Status: DC | PRN
  Administered 2017-11-30: 1 mL

## 2017-11-30 MED ORDER — LIDOCAINE HCL (CARDIAC) PF 100 MG/5ML IV SOSY
PREFILLED_SYRINGE | INTRAVENOUS | Status: DC | PRN
  Administered 2017-11-30: 80 mg via INTRAVENOUS

## 2017-11-30 MED ORDER — BUPIVACAINE HCL (PF) 0.5 % IJ SOLN
INTRAMUSCULAR | Status: AC
  Filled 2017-11-30: qty 30

## 2017-11-30 MED ORDER — FENTANYL CITRATE (PF) 100 MCG/2ML IJ SOLN
INTRAMUSCULAR | Status: AC
  Filled 2017-11-30: qty 2

## 2017-11-30 MED ORDER — FAMOTIDINE 20 MG PO TABS
ORAL_TABLET | ORAL | Status: AC
  Filled 2017-11-30: qty 1

## 2017-11-30 MED ORDER — ONDANSETRON HCL 4 MG/2ML IJ SOLN: 4.0000 mg | Freq: Once | INTRAMUSCULAR | Status: DC | PRN

## 2017-11-30 MED ORDER — OXYMETAZOLINE HCL 0.05 % NA SOLN
NASAL | Status: AC
  Filled 2017-11-30: qty 15

## 2017-11-30 MED ORDER — OXYMETAZOLINE HCL 0.05 % NA SOLN
NASAL | Status: DC | PRN
  Administered 2017-11-30: 1

## 2017-11-30 MED ORDER — ONDANSETRON HCL 4 MG/2ML IJ SOLN
INTRAMUSCULAR | Status: DC | PRN
  Administered 2017-11-30: 4 mg via INTRAVENOUS

## 2017-11-30 MED ORDER — GLYCOPYRROLATE 0.2 MG/ML IJ SOLN
INTRAMUSCULAR | Status: DC | PRN
  Administered 2017-11-30: 0.2 mg via INTRAVENOUS

## 2017-11-30 MED ORDER — ACETAMINOPHEN 10 MG/ML IV SOLN
INTRAVENOUS | Status: DC | PRN
  Administered 2017-11-30: 1000 mg via INTRAVENOUS

## 2017-11-30 MED ORDER — METHYLPREDNISOLONE SODIUM SUCC 125 MG IJ SOLR
INTRAMUSCULAR | Status: DC | PRN
  Administered 2017-11-30: 125 mg via INTRAVENOUS

## 2017-11-30 MED ORDER — PHENYLEPHRINE HCL 10 MG/ML IJ SOLN
INTRAMUSCULAR | Status: DC | PRN
  Administered 2017-11-30 (×2): 200 ug via INTRAVENOUS

## 2017-11-30 MED ORDER — SUCCINYLCHOLINE CHLORIDE 20 MG/ML IJ SOLN
INTRAMUSCULAR | Status: DC | PRN
  Administered 2017-11-30: 160 mg via INTRAVENOUS

## 2017-11-30 MED ORDER — OXYCODONE HCL 5 MG/5ML PO SOLN: 10.0000 mg | ORAL | 0 refills | Status: AC | PRN

## 2017-11-30 MED ORDER — METHYLPREDNISOLONE SODIUM SUCC 125 MG IJ SOLR
INTRAMUSCULAR | Status: AC
  Filled 2017-11-30: qty 2

## 2017-11-30 MED ORDER — DEXMEDETOMIDINE HCL IN NACL 200 MCG/50ML IV SOLN
INTRAVENOUS | Status: DC | PRN
  Administered 2017-11-30: 12 ug via INTRAVENOUS
  Administered 2017-11-30: 8 ug via INTRAVENOUS

## 2017-11-30 MED ORDER — MIDAZOLAM HCL 2 MG/2ML IJ SOLN
INTRAMUSCULAR | Status: AC
  Filled 2017-11-30: qty 2

## 2017-11-30 MED ORDER — LIDOCAINE VISCOUS HCL 2 % MT SOLN: 10.0000 mL | Freq: Four times a day (QID) | OROMUCOSAL | 0 refills | Status: AC | PRN

## 2017-11-30 SURGICAL SUPPLY — 14 items
BLADE BOVIE TIP EXT 4 (BLADE) ×3 IMPLANT
CANISTER SUCT 1200ML W/VALVE (MISCELLANEOUS) ×3 IMPLANT
CATH ROBINSON RED A/P 10FR (CATHETERS) ×3 IMPLANT
CATH ROBINSON RED A/P 14FR (CATHETERS) ×3 IMPLANT
COAG SUCT 10F 3.5MM HAND CTRL (MISCELLANEOUS) ×3 IMPLANT
ELECT REM PT RETURN 9FT ADLT (ELECTROSURGICAL) ×3
ELECTRODE REM PT RTRN 9FT ADLT (ELECTROSURGICAL) ×1 IMPLANT
GLOVE BIO SURGEON STRL SZ7.5 (GLOVE) ×3 IMPLANT
HANDLE SUCTION POOLE (INSTRUMENTS) ×1 IMPLANT
KIT TURNOVER KIT A (KITS) ×3 IMPLANT
NS IRRIG 500ML POUR BTL (IV SOLUTION) ×3 IMPLANT
PACK HEAD/NECK (MISCELLANEOUS) ×3 IMPLANT
SUCTION POOLE HANDLE (INSTRUMENTS) ×3
SYR 3ML LL SCALE MARK (SYRINGE) ×3 IMPLANT

## 2017-11-30 NOTE — H&P (Signed)
..  History and Physical paper copy reviewed and updated date of procedure and will be scanned into system.  Patient seen and examined.  

## 2017-11-30 NOTE — Transfer of Care (Signed)
Immediate Anesthesia Transfer of Care Note  Patient: Katelyn Baldwin  Procedure(s) Performed: TONSILLECTOMY (N/A )  Patient Location: PACU  Anesthesia Type:General  Level of Consciousness: sedated  Airway & Oxygen Therapy: Patient Spontanous Breathing and Patient connected to face mask oxygen  Post-op Assessment: Report given to RN and Post -op Vital signs reviewed and stable  Post vital signs: Reviewed and stable  Last Vitals:  Vitals Value Taken Time  BP 74/45 11/30/2017  9:25 AM  Temp    Pulse 63 11/30/2017  9:25 AM  Resp 17 11/30/2017  9:25 AM  SpO2 100 % 11/30/2017  9:25 AM  Vitals shown include unvalidated device data.  Last Pain:  Vitals:   11/30/17 0717  TempSrc: Oral  PainSc: 0-No pain         Complications: No apparent anesthesia complications

## 2017-11-30 NOTE — Anesthesia Preprocedure Evaluation (Signed)
Anesthesia Evaluation  Patient identified by MRN, date of birth, ID band Patient awake    Reviewed: Allergy & Precautions, H&P , NPO status , Patient's Chart, lab work & pertinent test results, reviewed documented beta blocker date and time   Airway Mallampati: II   Neck ROM: full    Dental  (+) Poor Dentition   Pulmonary neg pulmonary ROS,    Pulmonary exam normal        Cardiovascular Exercise Tolerance: Good hypertension, On Medications negative cardio ROS Normal cardiovascular exam Rhythm:regular Rate:Normal     Neuro/Psych  Headaches, PSYCHIATRIC DISORDERS Depression negative neurological ROS  negative psych ROS   GI/Hepatic negative GI ROS, Neg liver ROS,   Endo/Other  negative endocrine ROS  Renal/GU negative Renal ROS  negative genitourinary   Musculoskeletal negative musculoskeletal ROS (+)   Abdominal   Peds  Hematology negative hematology ROS (+)   Anesthesia Other Findings Past Medical History: No date: Depression 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, uterine     Comment:  [place] 01/03/11  No date: Headache No date: Hypertension Past Surgical History: No date: DILATION AND CURETTAGE OF UTERUS     Comment:  tubal pregnancy  BMI    Body Mass Index:  35.75 kg/m     Reproductive/Obstetrics negative OB ROS                             Anesthesia Physical  Anesthesia Plan  ASA: II  Anesthesia Plan: General   Post-op Pain Management:    Induction: Intravenous  PONV Risk Score and Plan:   Airway Management Planned: Oral ETT  Additional Equipment:   Intra-op Plan:   Post-operative Plan: Extubation in OR  Informed Consent: I have reviewed the patients History and Physical, chart, labs and discussed the procedure including the risks, benefits and alternatives for the proposed anesthesia with the patient or authorized representative who has indicated his/her understanding and acceptance.    Dental Advisory Given  Plan Discussed with: CRNA  Anesthesia Plan Comments:         Anesthesia Quick Evaluation

## 2017-11-30 NOTE — Anesthesia Postprocedure Evaluation (Signed)
Anesthesia Post Note  Patient: Katelyn Baldwin  Procedure(s) Performed: TONSILLECTOMY (N/A )  Patient location during evaluation: PACU Anesthesia Type: General Level of consciousness: awake and alert and oriented Pain management: pain level controlled Vital Signs Assessment: post-procedure vital signs reviewed and stable Respiratory status: spontaneous breathing Cardiovascular status: blood pressure returned to baseline Anesthetic complications: no     Last Vitals:  Vitals:   11/30/17 1019 11/30/17 1109  BP: (!) 149/74 (!) 144/75  Pulse: (!) 54 (!) 50  Resp: 14 16  Temp: (!) 36.1 C (!) 36.1 C  SpO2:  100%    Last Pain:  Vitals:   11/30/17 1109  TempSrc:   PainSc: 4                  [name]

## 2017-11-30 NOTE — Op Note (Signed)
..  11/30/2017  9:15 AM    Katelyn Baldwin  [id number]   Pre-Op Dx:  tonsillar hypertrophy  Post-op Dx: tonsillar hypertrophy  Proc:Tonsillectomy > age 51  Surg: Teneil Shiller  Anes:  General Endotracheal  EBL:  36ml  Comp:  None  Findings:  3+ cryptic tonsils with significant fibrotic scar to underlying musculature.  Pocket of purulence in left inferior tonsil.  Procedure: After the patient was identified in holding and the history and physical and consent was reviewed, the patient was taken to the operating room and placed in a supine position.  General endotracheal anesthesia was induced in the normal fashion.  At this time, the patient was rotated 45 degrees and a shoulder roll was placed.  At this time, a McIvor mouthgag was inserted into the patient's oral cavity and suspended from the Blue Ball stand without injury to teeth, lips, or gums.  Next a red rubber catheter was inserted into the patient left nostril for retraction of the uvula and soft palate superiorly.  Next a curved Alice clamp was attached to the patient's right superior tonsillar pole and retracted medially and inferiorly.  A Bovie electrocautery was used to dissect the patient's right tonsil in a subcapsular plane.  Meticulous hemostasis was achieved with Bovie suction cautery.  At this time, the mouth gag was released from suspension for 1 minute.  Attention now was directed to the patient's left side.  In a similar fashion the curved Alice clamp was attached to the superior pole and this was retracted medially and inferiorly and the tonsil was excised in a subcapsular plane with Bovie electrocautery.  After completion of the second tonsil, meticulous hemostasis was continued.  At this time, attention was directed to the patient's Adenoids.  Under indirect visualization using an operating mirror, the adenoid tissue was visualized and noted to be absent in nature.  At this time, the patient's nasal cavity and oral  cavity was irrigated with sterile saline.  One ml of 0.5% Marcaine was injected into the anterior and posterior tonsillar fossa bilaterally.  A single interupted vicryl suture was placed in tonsil pillars bilaterally to open up the patient's oropharynx.  Following this  The care of patient was returned to anesthesia, awakened, and transferred to recovery in stable condition.  Dispo:  PACU to home  Plan: Soft diet.  Limit exercise and strenuous activity for 2 weeks.  Fluid hydration  Recheck my office three weeks.   Katelyn Baldwin 9:15 AM 11/30/2017

## 2017-11-30 NOTE — Anesthesia Post-op Follow-up Note (Signed)
Anesthesia QCDR form completed.        

## 2017-11-30 NOTE — Anesthesia Procedure Notes (Signed)
Procedure Name: Intubation Date/Time: 11/30/2017 7:50 AM Performed by: Alvin Critchley, MD Pre-anesthesia Checklist: Patient identified, Emergency Drugs available, Suction available, Patient being monitored and Timeout performed Patient Re-evaluated:Patient Re-evaluated prior to induction Oxygen Delivery Method: Circle system utilized Preoxygenation: Pre-oxygenation with 100% oxygen Induction Type: IV induction Ventilation: Mask ventilation without difficulty and Oral airway inserted - appropriate to patient size Laryngoscope Size: Mac and 3 Grade View: Grade II Tube type: Oral Tube size: 7.0 mm Number of attempts: 1 Airway Equipment and Method: Stylet Placement Confirmation: ETT inserted through vocal cords under direct vision,  positive ETCO2 and breath sounds checked- equal and bilateral Secured at: 22 cm Tube secured with: Tape Dental Injury: Teeth and Oropharynx as per pre-operative assessment

## 2017-11-30 NOTE — OR Nursing (Signed)
Spoke with Dr Pryor Ochoa he will call in liquid amoxicillin to patients pharmacy. I also left a message with Becky Dr Pryor Ochoa nurse explaining that patient is out of her prednisone and Dr Pryor Ochoa would have to call that in if he wanted her to continue med.

## 2017-12-01 ENCOUNTER — Telehealth: Payer: Self-pay | Admitting: Internal Medicine

## 2017-12-01 LAB — QUANTIFERON-TB GOLD PLUS
Mitogen-NIL: >10 IU/mL
NIL: 0.03 IU/mL
QuantiFERON-TB Gold Plus: NEGATIVE
TB2-NIL: 0 IU/mL

## 2017-12-01 LAB — SURGICAL PATHOLOGY

## 2017-12-01 NOTE — Telephone Encounter (Signed)
mychart message has been sent and letter has been sent to inform patient of the results.

## 2017-12-01 NOTE — Telephone Encounter (Signed)
TB blood test negative mail copy to pt  Katelyn Baldwin

## 2018-01-22 ENCOUNTER — Telehealth: Payer: Self-pay

## 2018-01-22 DIAGNOSIS — I10 Essential (primary) hypertension: Secondary | ICD-10-CM

## 2018-01-22 MED ORDER — HYDROCHLOROTHIAZIDE 12.5 MG PO CAPS: 12.5000 mg | ORAL_CAPSULE | ORAL | 1 refills | Status: DC

## 2018-01-22 MED ORDER — AMLODIPINE BESYLATE 2.5 MG PO TABS: 2.5000 mg | ORAL_TABLET | ORAL | 1 refills | Status: DC

## 2018-01-22 NOTE — Telephone Encounter (Signed)
Medication has been refilled.  Copied from [place] [phone]. Topic: General - Other >> Jan 22, 2018  4:21 PM Carolyn Stare wrote:  Pt call to say she need refill on her BP medicine didn ot remember the name and only has 2 pills left   [place]

## 2018-01-23 MED ORDER — HYDROCHLOROTHIAZIDE 12.5 MG PO CAPS: 12.5000 mg | ORAL_CAPSULE | ORAL | 1 refills | Status: DC

## 2018-01-23 MED ORDER — AMLODIPINE BESYLATE 2.5 MG PO TABS: 2.5000 mg | ORAL_TABLET | ORAL | 1 refills | Status: DC

## 2018-01-23 NOTE — Addendum Note (Signed)
Addended by: Elpidio Galea T on: 01/23/2018 01:17 PM   Modules accepted: Orders

## 2018-01-23 NOTE — Telephone Encounter (Signed)
Medication was called into the wrong pharmacy.  [facility] [place].

## 2018-03-14 ENCOUNTER — Ambulatory Visit: Payer: Self-pay | Admitting: *Deleted

## 2018-03-14 NOTE — Telephone Encounter (Signed)
Patient experiencing right-sided abdominal pain for approximately 2 months. Describes as a stabbing and prickly pain that comes and goes and last only minutes. Tried Gas X-does not help. Denies any difficulty with N/V. LBM yesterday normal and formed. Voiding without difficulty. No sour taste in mouth. OVer the last few days she is having bloating in the upper abdomen that is constant. She started a cabbage water soup started yesterday. Advised to stop that and drink broth if desires not to eat due to the bloating. Denies drinking excessive alcohol regularly. Stated she thinks the white in her eyes appear yellowish today (after reading about it). Reviewed symptoms that would need immediate evaluation. Stated she understood. Appointment made for Friday. Reason for Disposition . Abdominal pain is a chronic symptom (recurrent or ongoing AND present > 4 weeks) . White of the eyes have turned yellow (i.e., jaundice)  Answer Assessment - Initial Assessment Questions 1. LOCATION: "Where does it hurt?"      Right-sided just above the hip pain 2. RADIATION: "Does the pain shoot anywhere else?" (e.g., chest, back)     no 3. ONSET: "When did the pain begin?" (e.g., minutes, hours or days ago)     About 2 months 4. SUDDEN: "Gradual or sudden onset?"     sudden 5. PATTERN "Does the pain come and go, or is it constant?"    - If constant: "Is it getting better, staying the same, or worsening?"      (Note: Constant means the pain never goes away completely; most serious pain is constant and it progresses)     - If intermittent: "How long does it last?" "Do you have pain now?"     (Note: Intermittent means the pain goes away completely between bouts)     Gotten worse over 2 months. Using heating pad and taking ibuprofen helps. 6. SEVERITY: "How bad is the pain?"  (e.g., Scale 1-10; mild, moderate, or severe)   - MILD (1-3): doesn't interfere with normal activities, abdomen soft and not tender to touch    -  MODERATE (4-7): interferes with normal activities or awakens from sleep, tender to touch    - SEVERE (8-10): excruciating pain, doubled over, unable to do any normal activities      Sharp, prickly severe pain that last only minutes. 7. RECURRENT SYMPTOM: "Have you ever had this type of abdominal pain before?" If so, ask: "When was the last time?" and "What happened that time?"      no 8. CAUSE: "What do you think is causing the abdominal pain?"     unsure 9. RELIEVING/AGGRAVATING FACTORS: "What makes it better or worse?" (e.g., movement, antacids, bowel movement)     Ibuprofen.  10. OTHER SYMPTOMS: "Has there been any vomiting, diarrhea, constipation, or urine problems?"       Bloating in the top part of abdomen. Normal stools, lbm yesterday.No difficulty with voiding.  11. PREGNANCY: "Is there any chance you are pregnant?" "When was your last menstrual period?"       no  Protocols used: ABDOMINAL PAIN - [facility]

## 2018-03-16 ENCOUNTER — Encounter: Payer: Self-pay | Admitting: Internal Medicine

## 2018-03-16 ENCOUNTER — Ambulatory Visit: Payer: No Typology Code available for payment source | Admitting: Internal Medicine

## 2018-03-21 ENCOUNTER — Ambulatory Visit: Payer: Self-pay | Admitting: Internal Medicine

## 2018-03-21 DIAGNOSIS — Z0289 Encounter for other administrative examinations: Secondary | ICD-10-CM

## 2019-02-13 ENCOUNTER — Other Ambulatory Visit: Payer: Self-pay | Admitting: Internal Medicine

## 2019-02-13 ENCOUNTER — Telehealth: Payer: Self-pay | Admitting: Internal Medicine

## 2019-02-13 DIAGNOSIS — I10 Essential (primary) hypertension: Secondary | ICD-10-CM

## 2019-02-13 MED ORDER — AMLODIPINE BESYLATE 2.5 MG PO TABS: 2.5000 mg | ORAL_TABLET | ORAL | 0 refills | Status: AC

## 2019-02-13 MED ORDER — HYDROCHLOROTHIAZIDE 12.5 MG PO CAPS: 12.5000 mg | ORAL_CAPSULE | ORAL | 0 refills | Status: AC

## 2019-02-13 NOTE — Telephone Encounter (Signed)
She has not been since in > 1 year  Needs in person appt for refills with me or another provider  If living in [place] may need to find PCP there Please schedule appt [place]

## 2019-02-13 NOTE — Telephone Encounter (Signed)
rx refill hydrochlorothiazide (MICROZIDE) 12.5 MG capsule  amLODipine (NORVASC) 2.5 MG tablet    [facility]  [address] [address] [place]

## 2019-02-14 NOTE — Telephone Encounter (Signed)
Ive sent 30 pills of 2 meds requested  Take my name off as PCP please

## 2019-02-14 NOTE — Telephone Encounter (Signed)
Done. NP

## 2019-02-14 NOTE — Telephone Encounter (Signed)
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 and has a new doctor , but the new doctor will not give her a Rx until her appt in December. Pt wanted to know if she can have 10 bp pills to last until her appt? Please advise and Thank you!

## 2019-02-15 ENCOUNTER — Telehealth: Payer: Self-pay | Admitting: Internal Medicine

## 2019-02-15 NOTE — Telephone Encounter (Signed)
Copied from [place] [id number]. Topic: General - Other >> Feb 15, 2019  8:46 AM Keene Breath wrote: Reason for CRM: Patient called to request a referral to have a mammogram.  Please advise and call patient to discuss at [phone]

## 2019-02-15 NOTE — Telephone Encounter (Signed)
Patient has been informed.

## 2019-02-15 NOTE — Telephone Encounter (Signed)
She will need to get new PCP to order this in [place] or [place] will need to ask for upcoming appt 03/2019

## 2019-06-13 IMAGING — CT CT HEAD W/O CM
3 series · 16 of 44 positions shown, 19 images · non-contrast
Comparison: None.

CLINICAL DATA: Hypertension with syncopal episode

EXAM:
CT HEAD WITHOUT CONTRAST
TECHNIQUE: Contiguous axial images were obtained from the base of the skull
through the vertex without intravenous contrast.

[Series 3: head wo · axial · 0.39mm/px · z∈[-138,-28]mm · 10 of 27 slices shown, 13 images]
[im 3/27  brain]
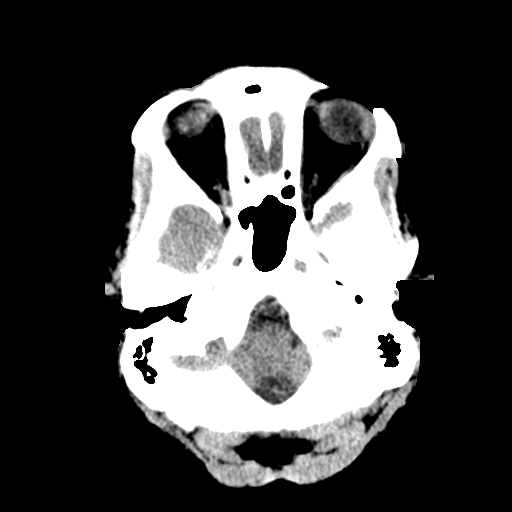
[im 3/27  bone]
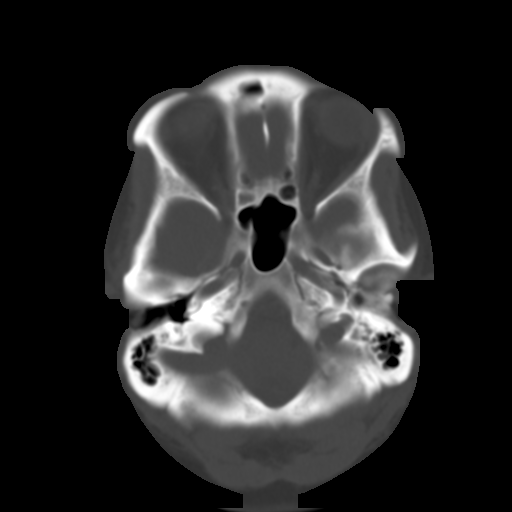
[im 5/27  brain]
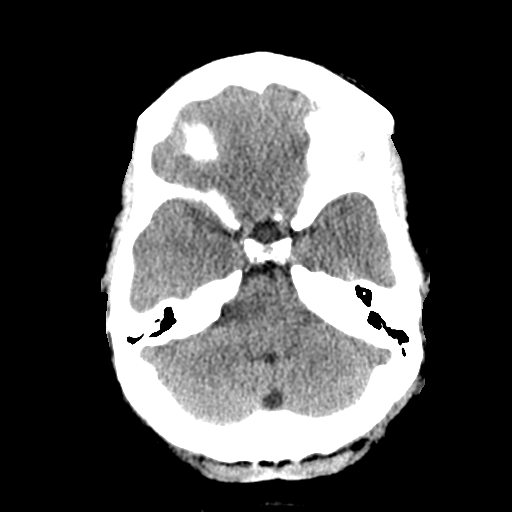
[im 8/27  brain]
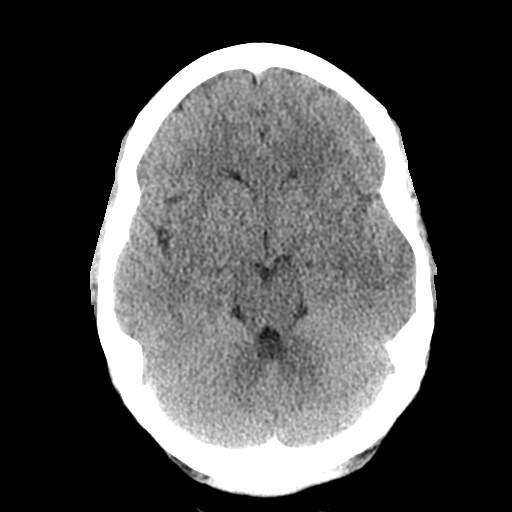
[im 10/27  brain]
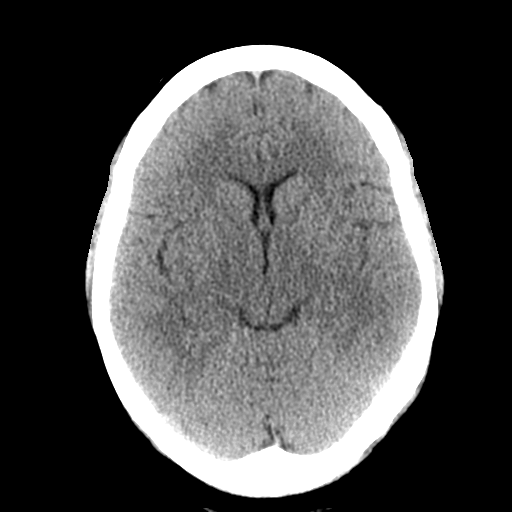
[im 13/27  brain]
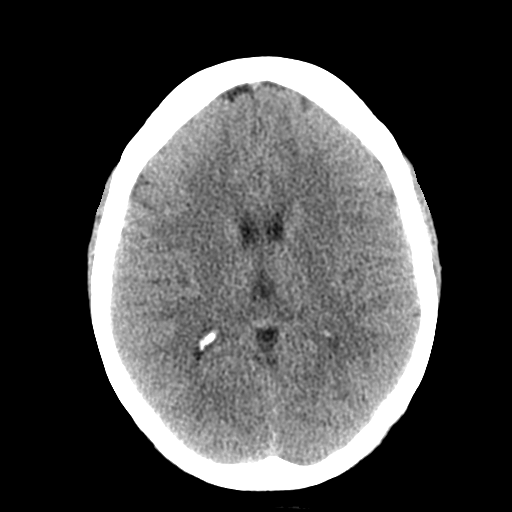
[im 13/27  bone]
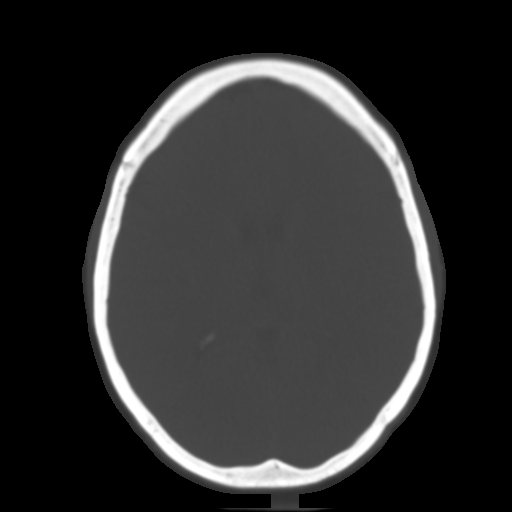
[im 15/27  brain]
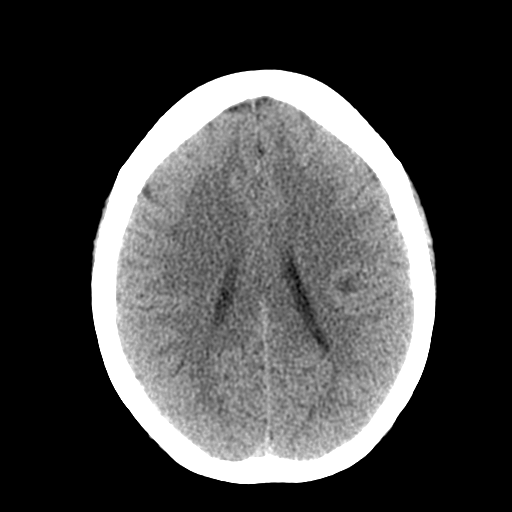
[im 18/27  brain]
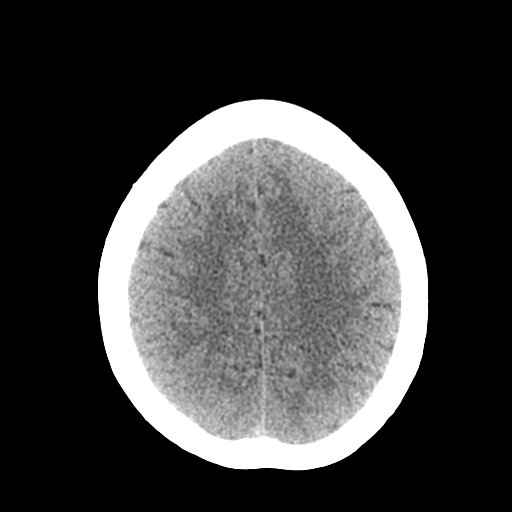
[im 20/27  brain]
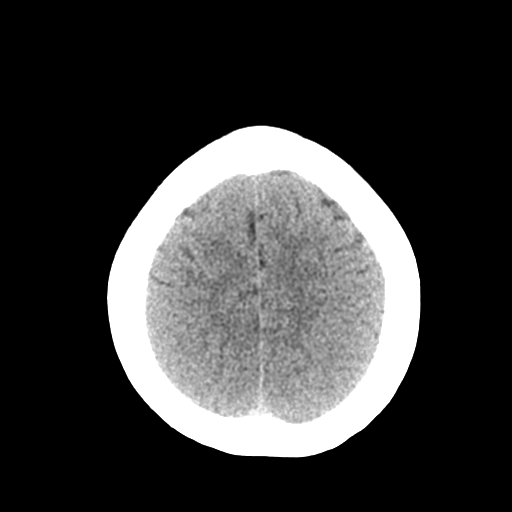
[im 23/27  brain]
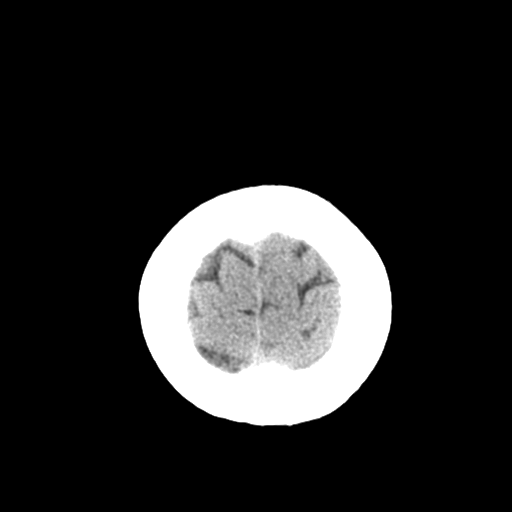
[im 23/27  bone]
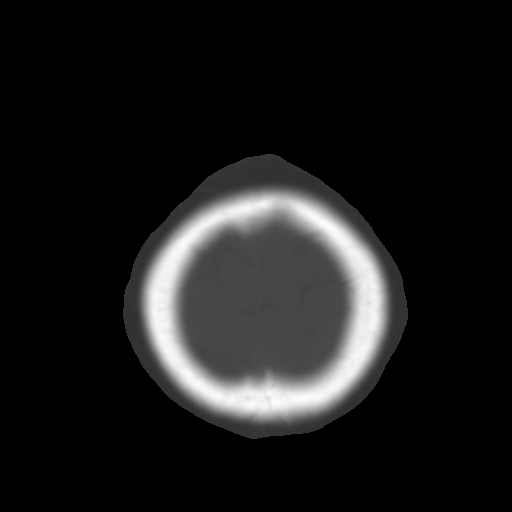
[im 25/27  brain]
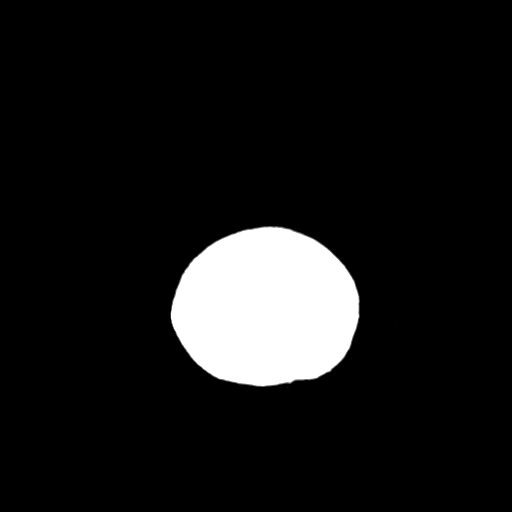

[Series 4: coronal soft tissue · coronal · 0.29mm/px · 3 of 62 slices shown]
[im 21/62  brain]
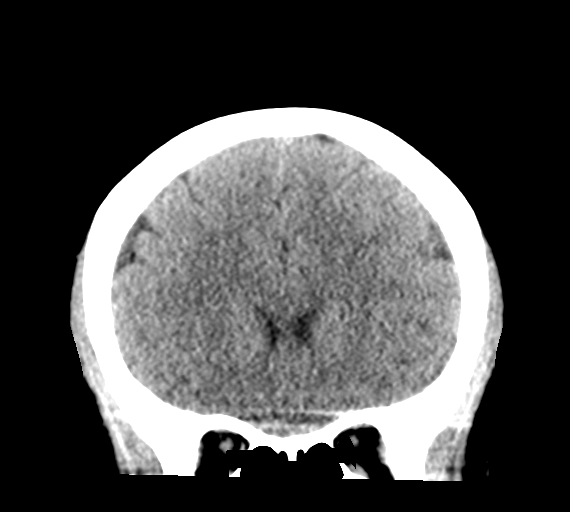
[im 28/62  brain]
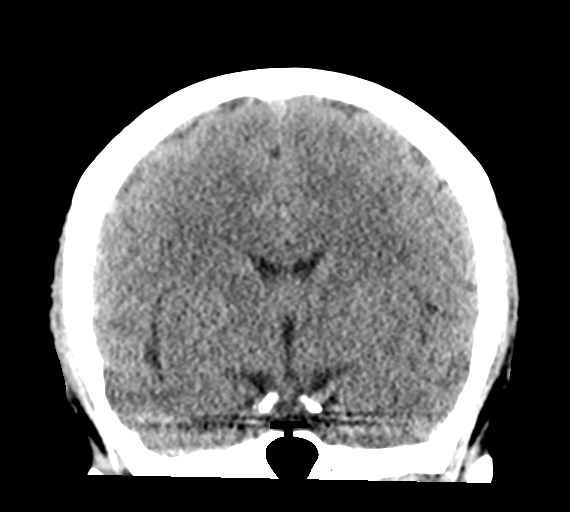
[im 34/62  brain]
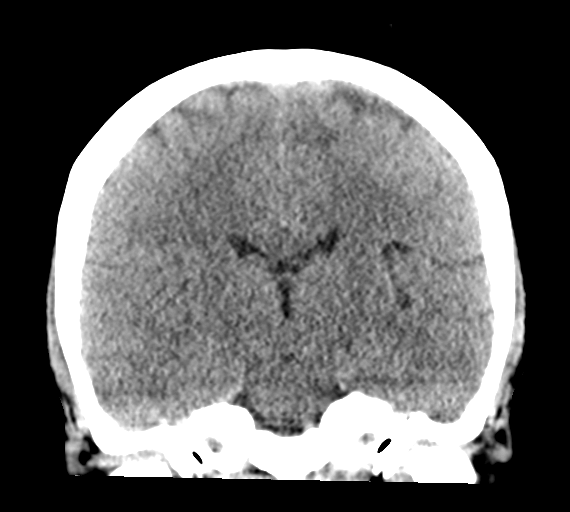

[Series 5: sagittal soft tissue · sagittal · 0.29mm/px · 3 of 56 slices shown]
[im 19/56  brain]
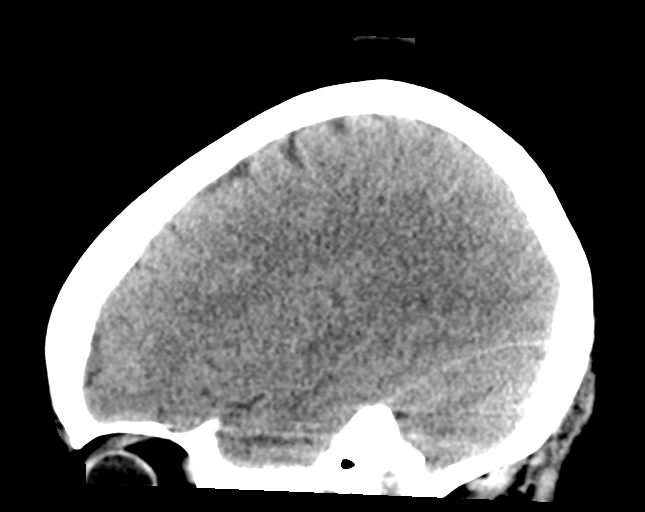
[im 28/56  brain]
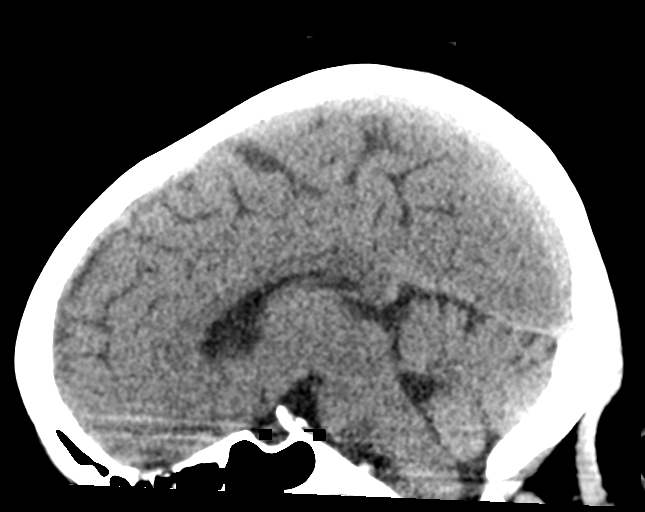
[im 37/56  brain]
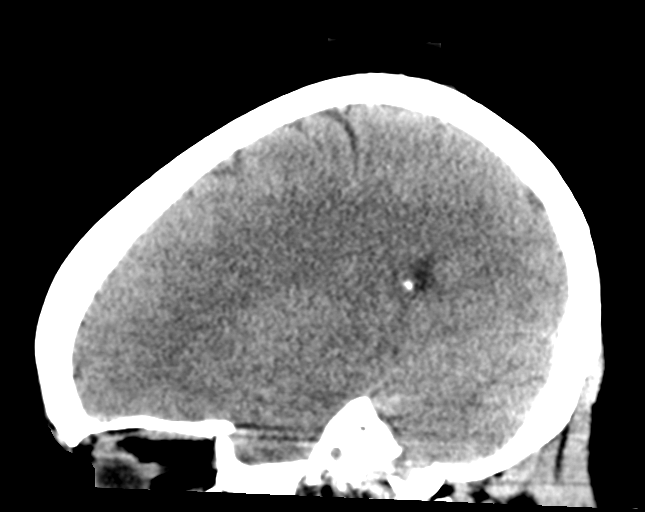

[16 of 44 positions shown; findings below may reference images not displayed]

FINDINGS: Brain: No evidence of acute infarction, hemorrhage, hydrocephalus,
extra-axial collection or mass lesion/mass effect.

Vascular: No hyperdense vessel or unexpected calcification.

Skull: Normal. Negative for fracture or focal lesion.

Sinuses/Orbits: No acute finding.

Other: None
IMPRESSION: Negative non contrasted CT appearance of the brain.
